# Patient Record
Sex: Female | Born: 1995
Health system: Southern US, Community
[De-identification: ages and names within clinical notes are randomized; demographics above are authoritative.]

## PROBLEM LIST (undated history)

## (undated) DIAGNOSIS — R079 Chest pain, unspecified: Secondary | ICD-10-CM

## (undated) DIAGNOSIS — I10 Essential (primary) hypertension: Secondary | ICD-10-CM

## (undated) DIAGNOSIS — N920 Excessive and frequent menstruation with regular cycle: Secondary | ICD-10-CM

## (undated) DIAGNOSIS — G459 Transient cerebral ischemic attack, unspecified: Secondary | ICD-10-CM

## (undated) DIAGNOSIS — N926 Irregular menstruation, unspecified: Secondary | ICD-10-CM

## (undated) DIAGNOSIS — E782 Mixed hyperlipidemia: Secondary | ICD-10-CM

## (undated) DIAGNOSIS — Z803 Family history of malignant neoplasm of breast: Secondary | ICD-10-CM

## (undated) DIAGNOSIS — E785 Hyperlipidemia, unspecified: Secondary | ICD-10-CM

## (undated) DIAGNOSIS — O210 Mild hyperemesis gravidarum: Secondary | ICD-10-CM

## (undated) DIAGNOSIS — F32A Depression, unspecified: Secondary | ICD-10-CM

## (undated) DIAGNOSIS — K6289 Other specified diseases of anus and rectum: Secondary | ICD-10-CM

## (undated) DIAGNOSIS — E559 Vitamin D deficiency, unspecified: Secondary | ICD-10-CM

## (undated) DIAGNOSIS — A749 Chlamydial infection, unspecified: Secondary | ICD-10-CM

## (undated) DIAGNOSIS — R002 Palpitations: Secondary | ICD-10-CM

## (undated) DIAGNOSIS — Z23 Encounter for immunization: Secondary | ICD-10-CM

## (undated) DIAGNOSIS — D689 Coagulation defect, unspecified: Secondary | ICD-10-CM

## (undated) DIAGNOSIS — O10919 Unspecified pre-existing hypertension complicating pregnancy, unspecified trimester: Secondary | ICD-10-CM

## (undated) DIAGNOSIS — K219 Gastro-esophageal reflux disease without esophagitis: Secondary | ICD-10-CM

## (undated) DIAGNOSIS — B2790 Infectious mononucleosis, unspecified without complication: Secondary | ICD-10-CM

## (undated) DIAGNOSIS — R0602 Shortness of breath: Secondary | ICD-10-CM

## (undated) DIAGNOSIS — R7303 Prediabetes: Secondary | ICD-10-CM

## (undated) DIAGNOSIS — F419 Anxiety disorder, unspecified: Secondary | ICD-10-CM

## (undated) HISTORY — DX: Vitamin D deficiency, unspecified: E55.9

## (undated) HISTORY — DX: Hyperlipidemia, unspecified: E78.5

## (undated) HISTORY — DX: Excessive and frequent menstruation with regular cycle: N92.0

## (undated) HISTORY — DX: Prediabetes: R73.03

## (undated) HISTORY — DX: Other specified diseases of anus and rectum: K62.89

## (undated) HISTORY — DX: Encounter for immunization: Z23

## (undated) HISTORY — DX: Chlamydial infection, unspecified: A74.9

## (undated) HISTORY — DX: Depression, unspecified: F32.A

## (undated) HISTORY — DX: Gastro-esophageal reflux disease without esophagitis: K21.9

## (undated) HISTORY — DX: Irregular menstruation, unspecified: N92.6

## (undated) HISTORY — DX: Family history of malignant neoplasm of breast: Z80.3

## (undated) HISTORY — DX: Infectious mononucleosis, unspecified without complication: B27.90

## (undated) HISTORY — PX: WISDOM TOOTH EXTRACTION: SHX21

---
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
## 2015-12-12 ENCOUNTER — Emergency Department
Admission: EM | Admit: 2015-12-12 | Discharge: 2015-12-12 | Disposition: A | Discharge status: Home / Self Care | Payer: BLUE CROSS/BLUE SHIELD | Attending: Emergency Medicine | Admitting: Emergency Medicine

## 2015-12-12 ENCOUNTER — Encounter: Payer: Self-pay | Admitting: Emergency Medicine

## 2015-12-12 DIAGNOSIS — R51 Headache: Secondary | ICD-10-CM | POA: Diagnosis not present

## 2015-12-12 DIAGNOSIS — M79602 Pain in left arm: Secondary | ICD-10-CM | POA: Diagnosis not present

## 2015-12-12 DIAGNOSIS — Z87891 Personal history of nicotine dependence: Secondary | ICD-10-CM | POA: Diagnosis not present

## 2015-12-12 HISTORY — DX: Coagulation defect, unspecified: D68.9

## 2015-12-12 HISTORY — DX: Transient cerebral ischemic attack, unspecified: G45.9

## 2015-12-12 LAB — BASIC METABOLIC PANEL
ANION GAP: 7 (ref 5–15)
BUN: 13 mg/dL (ref 6–20)
CO2: 26 mmol/L (ref 22–32)
Calcium: 9.6 mg/dL (ref 8.9–10.3)
Chloride: 105 mmol/L (ref 101–111)
Creatinine, Ser: 0.78 mg/dL (ref 0.44–1.00)
GFR calc Af Amer: >60 mL/min (ref >60)
GLUCOSE: 108 mg/dL — AB (ref 65–99)
POTASSIUM: 3.9 mmol/L (ref 3.5–5.1)
Sodium: 138 mmol/L (ref 135–145)

## 2015-12-12 LAB — CBC
HEMATOCRIT: 43.8 % (ref 35.0–47.0)
Hemoglobin: 15 g/dL (ref 12.0–16.0)
MCH: 27.4 pg (ref 26.0–34.0)
MCHC: 34.2 g/dL (ref 32.0–36.0)
MCV: 80.2 fL (ref 80.0–100.0)
PLATELETS: 335 10*3/uL (ref 150–440)
RBC: 5.46 MIL/uL — AB (ref 3.80–5.20)
RDW: 13.2 % (ref 11.5–14.5)
WBC: 6 10*3/uL (ref 3.6–11.0)

## 2015-12-12 MED ORDER — KETOROLAC TROMETHAMINE 30 MG/ML IJ SOLN
30.0000 mg | Freq: Once | INTRAMUSCULAR | Status: AC
  Administered 2015-12-12: 30 mg via INTRAVENOUS

## 2015-12-12 MED ORDER — PROCHLORPERAZINE EDISYLATE 5 MG/ML IJ SOLN
10.0000 mg | Freq: Once | INTRAMUSCULAR | Status: AC
  Administered 2015-12-12: 10 mg via INTRAVENOUS

## 2015-12-12 NOTE — ED Notes (Signed)
Pt reports pain in left arm began after she looked down and had pain first in left side of neck that traveled throughout left side from head to hip per pts report.

## 2015-12-12 NOTE — ED Notes (Signed)
Pt requested some crackers and drink, this nurse checked with EDP who said she could. Crackers, peanut butter and ginger ale given to pt.

## 2015-12-12 NOTE — ED Notes (Signed)
PT ambulatory at time of discharge. No acute distress noted at this time. Pt verbalized understanding of d/c instructions.

## 2015-12-12 NOTE — ED Provider Notes (Signed)
[facility] Emergency Department Provider Note    ____________________________________________   I have reviewed the triage vital signs and the nursing notes.   HISTORY  Chief Complaint Headache and Arm Pain   History limited by: Not Limited   HPI [name] is a 20 y.o. female who presents to the emergency department today because of concern for left sided pain. The pain is primarily located in the left arm. It starts in her shoulder and goes down to her fingers. She does feel some numbness in this area. It has improved since it started earlier today. The patient also complains of left sided migraine pain. Her headache was worse with bright lights. She has a history of migraine, back when she was in school. She denies being on any estrogen medication. No fevers.   Past Medical History:  Diagnosis Date  . Clotting disorder (HCC)   . Esophageal reflux   . Proctalgia    Intermittently  . TIA (transient ischemic attack) 2013    Patient Active Problem List   Diagnosis Date Noted  . Esophageal reflux   . Proctalgia     History reviewed. No pertinent surgical history.  Prior to Admission medications   Medication Sig Start Date End Date Taking? Authorizing Provider  pantoprazole (PROTONIX) 40 MG tablet Take 40 mg by mouth daily.   05/31/10   Historical Provider, MD  polyethylene glycol powder (GLYCOLAX/MIRALAX) powder Take 17 g by mouth daily.   05/31/10   Historical Provider, MD    Allergies Estrogens  History reviewed. No pertinent family history.  Social History Social History  Substance Use Topics  . Smoking status: Former Smoker    Types: Cigarettes    Quit date: 09/2015  . Smokeless tobacco: Never Used  . Alcohol use Yes     Comment: occasionally     Review of Systems  Constitutional: Negative for fever. Cardiovascular: Negative for chest pain. Respiratory: Negative for shortness of breath. Gastrointestinal: Negative for abdominal  pain, vomiting and diarrhea. Genitourinary: Negative for dysuria. Musculoskeletal: Negative for back pain. Skin: Negative for rash. Neurological: Positive for headache. Positive for left arm numbness.   10-point ROS otherwise negative.  ____________________________________________   PHYSICAL EXAM:  VITAL SIGNS: ED Triage Vitals  Enc Vitals Group     BP 12/12/15 1251 (!) 146/99     Pulse Rate 12/12/15 1251 90     Resp 12/12/15 1251 18     Temp 12/12/15 1251 98.2 F (36.8 C)     Temp Source 12/12/15 1251 Oral     SpO2 12/12/15 1251 94 %     Weight 12/12/15 1251 260 lb (117.9 kg)     Height 12/12/15 1251 5\' 8"  (1.727 m)     Head Circumference --      Peak Flow --      Pain Score 12/12/15 1255 5   Constitutional: Alert and oriented. Well appearing and in no distress. Eyes: Conjunctivae are normal. Normal extraocular movements. ENT   Head: Normocephalic and atraumatic.   Nose: No congestion/rhinnorhea.   Mouth/Throat: Mucous membranes are moist.   Neck: No stridor. Hematological/Lymphatic/Immunilogical: No cervical lymphadenopathy. Cardiovascular: Normal rate, regular rhythm.  No murmurs, rubs, or gallops.  Respiratory: Normal respiratory effort without tachypnea nor retractions. Breath sounds are clear and equal bilaterally. No wheezes/rales/rhonchi. Gastrointestinal: Soft and nontender. No distention.  Genitourinary: Deferred Musculoskeletal: Normal range of motion in all extremities. Left shoulder however tender to elevation and palpation. No lower extremity edema. Neurologic:  Normal speech  and language. No gross focal neurologic deficits are appreciated.  Skin:  Skin is warm, dry and intact. No rash noted. Psychiatric: Mood and affect are normal. Speech and behavior are normal. Patient exhibits appropriate insight and judgment.  ____________________________________________    LABS (pertinent positives/negatives)   Labs Reviewed  CBC - Abnormal; Notable  for the following:       Result Value   RBC 5.46 (*)    All other components within normal limits  BASIC METABOLIC PANEL - Abnormal; Notable for the following:    Glucose, Bld 108 (*)    All other components within normal limits    ____________________________________________   EKG  None  ____________________________________________    RADIOLOGY  None  ____________________________________________   PROCEDURES  Procedures  ____________________________________________   INITIAL IMPRESSION / ASSESSMENT AND PLAN / ED COURSE  Pertinent labs & imaging results that were available during my care of the patient were reviewed by me and considered in my medical decision making (see chart for details).  Patient with primary concern for left arm pain and migraine. On exam there is tenderness to manipulation of the left shoulder. Additionally it is tender to palpation. I wonder if radiculopathy is cause of pain and numbness. Will give patient toradol and compazine for headache  Clinical Course    Patient presented to the nursing station requesting discharge. She stated that she did feel better. Will discharge to follow up with PCP. ____________________________________________   FINAL CLINICAL IMPRESSION(S) / ED DIAGNOSES  Final diagnoses:  Left arm pain     Note: This dictation was prepared with Dragon dictation. Any transcriptional errors that result from this process are unintentional    Sierra SemenGraydon Leesha Veno, MD 12/12/15 1610

## 2015-12-12 NOTE — Discharge Instructions (Signed)
Please seek medical attention for any high fevers, chest pain, shortness of breath, change in behavior, persistent vomiting, bloody stool or any other new or concerning symptoms.  

## 2015-12-12 NOTE — ED Triage Notes (Signed)
Pt states she woke up this morning with pain and stiffness to entire L side. C/o headache 4/10. Currently only complaint is achy pain to LUE from shoulder to hand. No c/o numbness at this time. Hx TIA at 20yo. No focal weakness, no facial droop, no slurred speech noted.

## 2016-01-02 DIAGNOSIS — J029 Acute pharyngitis, unspecified: Secondary | ICD-10-CM | POA: Diagnosis not present

## 2016-03-28 ENCOUNTER — Ambulatory Visit: Payer: Self-pay | Admitting: Obstetrics and Gynecology

## 2016-05-04 ENCOUNTER — Ambulatory Visit (INDEPENDENT_AMBULATORY_CARE_PROVIDER_SITE_OTHER): Payer: BLUE CROSS/BLUE SHIELD | Admitting: Obstetrics and Gynecology

## 2016-05-04 ENCOUNTER — Encounter: Payer: Self-pay | Admitting: Obstetrics and Gynecology

## 2016-05-04 VITALS — BP 140/90 | HR 78 | Ht 69.0 in | Wt 260.0 lb

## 2016-05-04 DIAGNOSIS — F418 Other specified anxiety disorders: Secondary | ICD-10-CM

## 2016-05-04 DIAGNOSIS — Z803 Family history of malignant neoplasm of breast: Secondary | ICD-10-CM | POA: Diagnosis not present

## 2016-05-04 DIAGNOSIS — D6852 Prothrombin gene mutation: Secondary | ICD-10-CM

## 2016-05-04 DIAGNOSIS — F419 Anxiety disorder, unspecified: Secondary | ICD-10-CM

## 2016-05-04 DIAGNOSIS — Z113 Encounter for screening for infections with a predominantly sexual mode of transmission: Secondary | ICD-10-CM | POA: Diagnosis not present

## 2016-05-04 DIAGNOSIS — Z01419 Encounter for gynecological examination (general) (routine) without abnormal findings: Secondary | ICD-10-CM

## 2016-05-04 DIAGNOSIS — Z124 Encounter for screening for malignant neoplasm of cervix: Secondary | ICD-10-CM

## 2016-05-04 DIAGNOSIS — F329 Major depressive disorder, single episode, unspecified: Secondary | ICD-10-CM

## 2016-05-04 MED ORDER — SERTRALINE HCL 50 MG PO TABS: 50.0000 mg | ORAL_TABLET | Freq: Every day | ORAL | 1 refills | Status: DC

## 2016-05-04 NOTE — Progress Notes (Signed)
Chief Complaint  Patient presents with  . Gynecologic Exam    periods and paxil     HPI:      [name] is a 21 y.o. G0P0000 who LMP was Patient's last menstrual period was 04/19/2016., presents today for her annual examination.  Her menses are Q1-3 months, lasting 5 days.  Dysmenorrhea mild, occurring first 1-2 days of flow. She does not have intermenstrual bleeding. She had 2 periods in March but this is unusual for pt. She has been under increased stress recently.  Sex activity: not sexually active.   Hx of STDs: chlamydia  There is a FH of breast cancer in her pat aunt and questionable PGM, genetic testing hasn't been done. There is no FH of ovarian cancer. The patient does not do self-breast exams.  Tobacco use: The patient denies current or previous tobacco use. Alcohol use: none Exercise: moderately active  She does not get adequate calcium and Vitamin D in her diet.  She has a hx of anxiety and depression since adolescence. She tried paxil last yr for 2 months but felt it made her more angry and unable to concentrate, so she stopped it. She also would rather not be on any meds but feels her anxiety is constant and she doesn't like that. She is very busy with school/work and feels the stress exacerbates sx. She is sleeping well and has just started diet/exercise program. She feels extra emotional. She doesn't have time to see therapist right now, but has seen one in the past. She is interested in meds again. She notes feeling down, tired with poor apptetite, and trouble concentrating. She has had SI. She also feels nervous, with excessive worry, trouble relaxing, and being irritable. Some of her sx are situational and come and go.   Past Medical History:  Diagnosis Date  . Chlamydia 6/15, 11/15, 2/16  . Clotting disorder (HCC) 2012   PROTHROMBIN GENE MUTATION AND fACTOR viii - NO ESTROGEN PRODUCTS  . Esophageal reflux   . Family history of breast cancer 05/08/2013   WILL DISCUSS GENETIC TESTING WHEN PT TURNS 21  . Immunization, viral disease    GARDASIL SERIES COMPLETED  . Irregular menses 03/19/2012  . Menorrhagia 2012   OFF BC  . Mononucleosis 01/08/2014   [facility]  . Pre-diabetes 2014   [facility] IN THE PAST  . Proctalgia    Intermittently  . TIA (transient ischemic attack) 2013    Past Surgical History:  Procedure Laterality Date  . WISDOM TOOTH EXTRACTION      Family History  Problem Relation Age of Onset  . Clotting disorder Mother   . Breast cancer Paternal Aunt   . Hypertension Maternal Grandmother   . Heart disease Maternal Grandfather   . Heart attack Maternal Grandfather     Social History   Social History  . Marital status: Single    Spouse name: N/A  . Number of children: N/A  . Years of education: N/A   Occupational History  . Not on file.   Social History Main Topics  . Smoking status: Former Smoker    Types: Cigarettes    Quit date: 09/2015  . Smokeless tobacco: Never Used  . Alcohol use Yes     Comment: occasionally   . Drug use: No  . Sexual activity: Yes    Birth control/ protection: Condom   Other Topics Concern  . Not on file   Social History Narrative  . No narrative on file  Review of Systems  Constitutional: Negative for fever, malaise/fatigue and weight loss.  HENT: Negative for congestion, ear pain and sinus pain.   Respiratory: Negative for cough, shortness of breath and wheezing.   Cardiovascular: Negative for chest pain, orthopnea and leg swelling.  Gastrointestinal: Negative for constipation, diarrhea, nausea and vomiting.  Genitourinary: Negative for dysuria, frequency, hematuria and urgency.       Breast ROS: negative   Musculoskeletal: Negative for back pain, joint pain and myalgias.  Skin: Negative for itching and rash.  Neurological: Positive for headaches. Negative for dizziness, tingling and focal weakness.  Endo/Heme/Allergies: Negative for environmental allergies.  Does not bruise/bleed easily.  Psychiatric/Behavioral: Positive for depression. Negative for suicidal ideas. The patient is nervous/anxious. The patient does not have insomnia.     Objective: BP 140/90 (Patient Position: Sitting)   Pulse 78   Ht  (1.753 m)   Wt 260 lb (117.9 kg)   LMP 04/19/2016   BMI 38.40 kg/m    Physical Exam  Constitutional: She is oriented to person, place, and time. She appears well-developed and well-nourished.  Genitourinary: Vagina normal and uterus normal. No erythema or tenderness in the vagina. No vaginal discharge found. Right adnexum does not display mass and does not display tenderness. Left adnexum does not display mass and does not display tenderness. Cervix does not exhibit motion tenderness or polyp. Uterus is not enlarged or tender.  Neck: Normal range of motion. No thyromegaly present.  Cardiovascular: Normal rate, regular rhythm and normal heart sounds.   No murmur heard. Pulmonary/Chest: Effort normal and breath sounds normal. Right breast exhibits no mass, no nipple discharge, no skin change and no tenderness. Left breast exhibits no mass, no nipple discharge, no skin change and no tenderness.  Abdominal: Soft. There is no tenderness. There is no guarding.  Musculoskeletal: Normal range of motion.  Neurological: She is alert and oriented to person, place, and time. No cranial nerve deficit.  Psychiatric: She has a normal mood and affect. Her behavior is normal.  Vitals reviewed.   Results: GAD-7=20 PHQ-9=22  Assessment/Plan: Encounter for annual routine gynecological examination  Cervical cancer screening - Plan: IGP,CtNgTv,rfx Aptima HPV ASCU  Screening for STD (sexually transmitted disease) - Plan: IGP,CtNgTv,rfx Aptima HPV ASCU  Prothrombin gene mutation (HCC)  Family history of breast cancer - My Risk testing discussed. Will wait till pt is a little older. F/u prn.  Anxiety and depression - Will try zoloft. Pt didn't do well  with paxil. RTO in 7 wks for f/u/sooner prn. - Plan: sertraline (ZOLOFT) 50 MG tablet             GYN counsel adequate intake of calcium and vitamin D, diet and exercise     F/U  Return in about 7 weeks (around 06/22/2016) for anxiety f/u.  Ameya Vowell B. Audra Kagel, PA-C 05/04/2016 5:33 PM

## 2016-05-09 LAB — IGP,CTNGTV,RFX APTIMA HPV ASCU
CHLAMYDIA, NUC. ACID AMP: NEGATIVE
GONOCOCCUS, NUC. ACID AMP: NEGATIVE
PAP SMEAR COMMENT: 0
TRICH VAG BY NAA: NEGATIVE

## 2016-05-20 DIAGNOSIS — J0120 Acute ethmoidal sinusitis, unspecified: Secondary | ICD-10-CM | POA: Diagnosis not present

## 2016-06-16 ENCOUNTER — Encounter: Payer: Self-pay | Admitting: Obstetrics and Gynecology

## 2016-06-16 ENCOUNTER — Ambulatory Visit (INDEPENDENT_AMBULATORY_CARE_PROVIDER_SITE_OTHER): Payer: BLUE CROSS/BLUE SHIELD | Admitting: Obstetrics and Gynecology

## 2016-06-16 VITALS — BP 130/90 | HR 94 | Ht 69.0 in | Wt 256.0 lb

## 2016-06-16 DIAGNOSIS — R040 Epistaxis: Secondary | ICD-10-CM

## 2016-06-16 DIAGNOSIS — F419 Anxiety disorder, unspecified: Secondary | ICD-10-CM

## 2016-06-16 DIAGNOSIS — F329 Major depressive disorder, single episode, unspecified: Secondary | ICD-10-CM | POA: Insufficient documentation

## 2016-06-16 MED ORDER — SERTRALINE HCL 100 MG PO TABS: 100.0000 mg | ORAL_TABLET | Freq: Every day | ORAL | 0 refills | Status: DC

## 2016-06-16 NOTE — Progress Notes (Signed)
Chief Complaint  Patient presents with  . Follow-up    HPI:      [name] is a 21 y.o. G0P0000 who LMP was Patient's last menstrual period was 06/11/2016., presents today for anxiety/depression f/u from 05/04/16. She was started on zoloft 50 mg daily. She states she has noticed some improvement of anxiety/depression sx but still has worry, sleeps a lot, has little energy/motivation, difficulty concentrating. She feels less angry. She still has SI, but they are less frequent and less "heavy", and she wouldn't actually go through with it. She denies any side effects from zoloft.   05/04/16 She has a hx of anxiety and depression since adolescence. She tried paxil last yr for 2 months but felt it made her more angry and unable to concentrate, so she stopped it. She also would rather not be on any meds but feels her anxiety is constant and she doesn't like that. She is very busy with school/work and feels the stress exacerbates sx. She is sleeping well and has just started diet/exercise program. She feels extra emotional. She doesn't have time to see therapist right now, but has seen one in the past. She is interested in meds again. She notes feeling down, tired with poor apptetite, and trouble concentrating. She has had SI. She also feels nervous, with excessive worry, trouble relaxing, and being irritable. Some of her sx are situational and come and go.  Pt also complains of frequent nose bleeds, bilat. Sx started 12/17 and occur 1-3 times weekly, even with just minimally touching her nose. Sx are random. She had 2 nose piercings and removed them, but sx persist. She sometimes needs 1 tissue or a bunch of paper towels. She denies any trauma to nose, nasal tissue doesn't feel dry.   Patient Active Problem List   Diagnosis Date Noted  . Anxiety and depression 06/16/2016  . Hypertension 02/01/2012  . Insulin resistance 02/01/2012  . Obesity 02/01/2012  . Pancreatitis, acute 02/01/2012  .  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 Esophageal reflux   . Proctalgia     Family History  Problem Relation Age of Onset  . Clotting disorder Mother   . Breast cancer Paternal Aunt   . Hypertension Maternal Grandmother   . Heart disease Maternal Grandfather   . Heart attack Maternal Grandfather     Social History   Social History  . Marital status: Single    Spouse name: N/A  . Number of children: N/A  . Years of education: N/A   Occupational History  . Not on file.   Social History Main Topics  . Smoking status: Former Smoker    Types: Cigarettes    Quit date: 09/2015  . Smokeless tobacco: Never Used  . Alcohol use Yes     Comment: occasionally   . Drug use: No  . Sexual activity: Yes    Birth control/ protection: Condom   Other Topics Concern  . Not on file   Social History Narrative  . No narrative on file     Current Outpatient Prescriptions:  .  sertraline (ZOLOFT) 100 MG tablet, Take 1 tablet (100 mg total) by mouth daily., Disp: 30 tablet, Rfl: 0  Review of Systems  Constitutional: Positive for appetite change.  Neurological: Positive for headaches. Negative for dizziness, light-headedness and numbness.  Psychiatric/Behavioral: Positive for agitation, dysphoric mood, sleep disturbance and suicidal ideas. Negative for hallucinations and self-injury. The patient is nervous/anxious.      OBJECTIVE:  Vitals:  BP 130/90   Pulse 94   Ht 5\' 9"  (1.753 m)   Wt 256 lb (116.1 kg)   LMP 06/11/2016   BMI 37.80 kg/m   Physical Exam  DEFERRED  Results: GAD-7=14 (WAS 20) PHG-9=17 (WAS 22)  Assessment/Plan: Anxiety and depression - Increase zoloft to 100 mg dose. Rx eRxd. F/u in 4 wks for sx/sooner prn. - Plan: sertraline (ZOLOFT) 100 MG tablet  Epistaxis, recurrent - Refer to ENT.  - Plan: Ambulatory referral to ENT     Meds ordered this encounter  Medications  . sertraline (ZOLOFT) 100 MG tablet    Sig: Take 1 tablet (100 mg total)  by mouth daily.    Dispense:  30 tablet    Refill:  0      Return in about 4 weeks (around 07/14/2016) for depression f/u.  Alicia B. Copland, PA-C 06/16/2016 10:23 AM

## 2016-07-05 ENCOUNTER — Other Ambulatory Visit: Payer: Self-pay | Admitting: Obstetrics and Gynecology

## 2016-07-13 ENCOUNTER — Other Ambulatory Visit: Payer: Self-pay | Admitting: Obstetrics and Gynecology

## 2016-07-13 ENCOUNTER — Ambulatory Visit: Payer: BLUE CROSS/BLUE SHIELD | Admitting: Obstetrics and Gynecology

## 2016-07-13 DIAGNOSIS — F329 Major depressive disorder, single episode, unspecified: Secondary | ICD-10-CM

## 2016-07-13 DIAGNOSIS — F419 Anxiety disorder, unspecified: Principal | ICD-10-CM

## 2016-07-18 ENCOUNTER — Ambulatory Visit: Payer: BLUE CROSS/BLUE SHIELD | Admitting: Obstetrics and Gynecology

## 2017-01-30 ENCOUNTER — Ambulatory Visit (INDEPENDENT_AMBULATORY_CARE_PROVIDER_SITE_OTHER): Payer: 59 | Admitting: Obstetrics and Gynecology

## 2017-01-30 ENCOUNTER — Encounter: Payer: Self-pay | Admitting: Obstetrics and Gynecology

## 2017-01-30 VITALS — BP 138/98 | HR 92 | Ht 69.0 in | Wt 254.0 lb

## 2017-01-30 DIAGNOSIS — N926 Irregular menstruation, unspecified: Secondary | ICD-10-CM

## 2017-01-30 DIAGNOSIS — Z309 Encounter for contraceptive management, unspecified: Secondary | ICD-10-CM

## 2017-01-30 LAB — POCT URINE PREGNANCY: Preg Test, Ur: NEGATIVE

## 2017-01-30 NOTE — Progress Notes (Signed)
Chief Complaint  Patient presents with  . Late Period    HPI:      [name] is a 22 y.o. G0P0000 who LMP was Patient's last menstrual period was 12/26/2016., presents today for late menses, as well as urinary frequency, fatigue, and breast tenderness. Menses are usually monthly, lasting 5-6 days, no BTB, occas dysmen. She is sex active, using pull out method/natural family planning. She had 3 mildly faint pos UPTs 2 days ago, 2 neg UPTs yesterday, and neg UPT today in office, but not first morning specimen. She also has a hx of menses Q1-3 months in the past. No recent sickness/med change/wt change/increased stress. She has a clotting disorder and is high risk if gets pregnant.  She stopped her zoloft a few months back for anxiety/depression. Sx are manageable.    Past Medical History:  Diagnosis Date  . Chlamydia 6/15, 11/15, 2/16  . Clotting disorder (HCC) 2012   PROTHROMBIN GENE MUTATION AND fACTOR viii - NO ESTROGEN PRODUCTS  . Esophageal reflux   . Family history of breast cancer 05/08/2013   WILL DISCUSS GENETIC TESTING WHEN PT TURNS 21  . Immunization, viral disease    GARDASIL SERIES COMPLETED  . Irregular menses 03/19/2012  . Menorrhagia 2012   OFF BC  . Mononucleosis 01/08/2014   [facility]  . Pre-diabetes 2014   [facility] IN THE PAST  . Proctalgia    Intermittently  . TIA (transient ischemic attack) 2013    Past Surgical History:  Procedure Laterality Date  . WISDOM TOOTH EXTRACTION      Family History  Problem Relation Age of Onset  . Clotting disorder Mother   . Breast cancer Paternal Aunt   . Hypertension Maternal Grandmother   . Heart disease Maternal Grandfather   . Heart attack Maternal Grandfather     Social History   Socioeconomic History  . Marital status: Single    Spouse name: Not on file  . Number of children: Not on file  . Years of education: Not on file  . Highest education level: Not on file  Social Needs  . Financial  resource strain: Not on file  . Food insecurity - worry: Not on file  . Food insecurity - inability: Not on file  . Transportation needs - medical: Not on file  . Transportation needs - non-medical: Not on file  Occupational History  . Not on file  Tobacco Use  . Smoking status: Former Smoker    Types: Cigarettes    Last attempt to quit: 09/2015    Years since quitting: 1.3  . Smokeless tobacco: Never Used  Substance and Sexual Activity  . Alcohol use: Yes    Comment: occasionally   . Drug use: No  . Sexual activity: Yes    Birth control/protection: Condom, None  Other Topics Concern  . Not on file  Social History Narrative  . Not on file     Current Outpatient Medications:  .  sertraline (ZOLOFT) 100 MG tablet, TAKE 1 TABLET BY MOUTH EVERY DAY, Disp: 30 tablet, Rfl: 0   ROS:  Review of Systems  Constitutional: Positive for fatigue. Negative for fever.  Gastrointestinal: Negative for blood in stool, constipation, diarrhea, nausea and vomiting.  Genitourinary: Positive for frequency. Negative for dyspareunia, dysuria, flank pain, hematuria, urgency, vaginal bleeding, vaginal discharge and vaginal pain.  Musculoskeletal: Negative for back pain.  Skin: Negative for rash.     OBJECTIVE:   Vitals:  BP (!) 138/98  Pulse 92   Ht 5\' 9"  (1.753 m)   Wt 254 lb (115.2 kg)   LMP 12/26/2016   BMI 37.51 kg/m   Physical Exam  Constitutional: She is oriented to person, place, and time and well-developed, well-nourished, and in no distress.  Neurological: She is alert and oriented to person, place, and time.  Psychiatric: Memory, affect and judgment normal.  Vitals reviewed.   Results: Results for orders placed or performed in visit on 01/30/17 (from the past 24 hour(s))  POCT urine pregnancy     Status: Normal   Collection Time: 01/30/17  3:59 PM  Result Value Ref Range   Preg Test, Ur Negative Negative     Assessment/Plan: Late period - Neg UPT today. Slightly  pos UPTs at home 2 days ago. Check serum beta. Will f/u with results. If neg, reassurance. F/u if no menses in 3 months.  - Plan: POCT urine pregnancy, hCG, serum, qualitative  Encounter for contraceptive management, unspecified type - Pt not interested in BC/condoms    Return if symptoms worsen or fail to improve.  Wynton Hufstetler B. Levana Minetti, PA-C 01/30/2017 4:20 PM

## 2017-01-30 NOTE — Patient Instructions (Signed)
I value your feedback and entrusting us with your care. If you get a [facility] patient survey, I would appreciate you taking the time to let us know about your experience today. Thank you! 

## 2017-01-31 LAB — HCG, SERUM, QUALITATIVE: HCG, BETA SUBUNIT, QUAL, SERUM: NEGATIVE m[IU]/mL (ref <6)

## 2017-03-20 ENCOUNTER — Other Ambulatory Visit: Payer: Self-pay

## 2017-03-20 ENCOUNTER — Other Ambulatory Visit: Payer: Self-pay | Admitting: Obstetrics and Gynecology

## 2017-03-20 ENCOUNTER — Telehealth: Payer: Self-pay

## 2017-03-20 ENCOUNTER — Ambulatory Visit (INDEPENDENT_AMBULATORY_CARE_PROVIDER_SITE_OTHER): Payer: BLUE CROSS/BLUE SHIELD

## 2017-03-20 DIAGNOSIS — N3001 Acute cystitis with hematuria: Secondary | ICD-10-CM

## 2017-03-20 DIAGNOSIS — N390 Urinary tract infection, site not specified: Secondary | ICD-10-CM | POA: Insufficient documentation

## 2017-03-20 DIAGNOSIS — R300 Dysuria: Secondary | ICD-10-CM | POA: Diagnosis not present

## 2017-03-20 LAB — POCT URINALYSIS DIPSTICK
BILIRUBIN UA: NEGATIVE
Glucose, UA: NEGATIVE
Ketones, UA: NEGATIVE
NITRITE UA: POSITIVE
PH UA: 7 (ref 5.0–8.0)
PROTEIN UA: NEGATIVE
RBC UA: POSITIVE
SPEC GRAV UA: 1.02 (ref 1.010–1.025)
UROBILINOGEN UA: NEGATIVE U/dL — AB

## 2017-03-20 MED ORDER — NITROFURANTOIN MONOHYD MACRO 100 MG PO CAPS: 100.0000 mg | ORAL_CAPSULE | Freq: Two times a day (BID) | ORAL | 0 refills | Status: AC

## 2017-03-20 NOTE — Telephone Encounter (Signed)
Pt states she has another UTI, has tried AZO, now sxs are 10times worse - pain c urination, frequency.  Can antibx be called in?  [id number].

## 2017-03-20 NOTE — Telephone Encounter (Signed)
Pt scheduled for 1:30 today

## 2017-03-20 NOTE — Progress Notes (Signed)
UTI sx for a couple days. Pos dipstick. Check C&S. Rx macrobid.   Results for orders placed or performed in visit on 03/20/17 (from the past 24 hour(s))  POCT Urinalysis Dipstick     Status: Abnormal   Collection Time: 03/20/17  1:47 PM  Result Value Ref Range   Color, UA dark yellow    Clarity, UA cloudy    Glucose, UA neg    Bilirubin, UA neg    Ketones, UA neg    Spec Grav, UA 1.020 1.010 - 1.025   Blood, UA pos    pH, UA 7.0 5.0 - 8.0   Protein, UA neg    Urobilinogen, UA negative (A) 0.2 or 1.0 E.U./dL   Nitrite, UA pos    Leukocytes, UA Large (3+) (A) Negative   Appearance dark yellow    Odor strong

## 2017-03-20 NOTE — Telephone Encounter (Signed)
No C&S since 2016, have pt come by with nurse for urine dipstick today. I can Rx abx if pos. Thx.

## 2017-03-24 LAB — URINE CULTURE

## 2017-05-02 DIAGNOSIS — Z01411 Encounter for gynecological examination (general) (routine) with abnormal findings: Secondary | ICD-10-CM | POA: Diagnosis not present

## 2017-05-02 DIAGNOSIS — Z113 Encounter for screening for infections with a predominantly sexual mode of transmission: Secondary | ICD-10-CM | POA: Diagnosis not present

## 2017-05-02 DIAGNOSIS — Z3169 Encounter for other general counseling and advice on procreation: Secondary | ICD-10-CM | POA: Diagnosis not present

## 2017-05-02 DIAGNOSIS — Z7689 Persons encountering health services in other specified circumstances: Secondary | ICD-10-CM | POA: Diagnosis not present

## 2017-05-11 ENCOUNTER — Ambulatory Visit: Payer: BLUE CROSS/BLUE SHIELD

## 2017-08-23 DIAGNOSIS — I150 Renovascular hypertension: Secondary | ICD-10-CM | POA: Diagnosis not present

## 2017-08-23 DIAGNOSIS — F413 Other mixed anxiety disorders: Secondary | ICD-10-CM | POA: Diagnosis not present

## 2017-10-18 DIAGNOSIS — F413 Other mixed anxiety disorders: Secondary | ICD-10-CM | POA: Diagnosis not present

## 2017-10-18 DIAGNOSIS — Z79899 Other long term (current) drug therapy: Secondary | ICD-10-CM | POA: Diagnosis not present

## 2017-11-24 DIAGNOSIS — E559 Vitamin D deficiency, unspecified: Secondary | ICD-10-CM | POA: Insufficient documentation

## 2017-11-29 DIAGNOSIS — Z7689 Persons encountering health services in other specified circumstances: Secondary | ICD-10-CM | POA: Diagnosis not present

## 2017-11-29 DIAGNOSIS — F419 Anxiety disorder, unspecified: Secondary | ICD-10-CM | POA: Diagnosis not present

## 2017-11-29 DIAGNOSIS — Z23 Encounter for immunization: Secondary | ICD-10-CM | POA: Diagnosis not present

## 2017-11-29 DIAGNOSIS — Z13220 Encounter for screening for lipoid disorders: Secondary | ICD-10-CM | POA: Diagnosis not present

## 2017-11-29 DIAGNOSIS — E669 Obesity, unspecified: Secondary | ICD-10-CM | POA: Diagnosis not present

## 2017-11-29 DIAGNOSIS — D6852 Prothrombin gene mutation: Secondary | ICD-10-CM | POA: Diagnosis not present

## 2017-11-29 DIAGNOSIS — D66 Hereditary factor VIII deficiency: Secondary | ICD-10-CM | POA: Diagnosis not present

## 2017-11-29 DIAGNOSIS — F329 Major depressive disorder, single episode, unspecified: Secondary | ICD-10-CM | POA: Diagnosis not present

## 2017-11-29 DIAGNOSIS — E8881 Metabolic syndrome: Secondary | ICD-10-CM | POA: Diagnosis not present

## 2017-11-29 DIAGNOSIS — I10 Essential (primary) hypertension: Secondary | ICD-10-CM | POA: Diagnosis not present

## 2017-11-29 DIAGNOSIS — Z111 Encounter for screening for respiratory tuberculosis: Secondary | ICD-10-CM | POA: Diagnosis not present

## 2017-12-28 DIAGNOSIS — F419 Anxiety disorder, unspecified: Secondary | ICD-10-CM | POA: Diagnosis not present

## 2017-12-28 DIAGNOSIS — E559 Vitamin D deficiency, unspecified: Secondary | ICD-10-CM | POA: Diagnosis not present

## 2017-12-28 DIAGNOSIS — I10 Essential (primary) hypertension: Secondary | ICD-10-CM | POA: Diagnosis not present

## 2017-12-28 DIAGNOSIS — E669 Obesity, unspecified: Secondary | ICD-10-CM | POA: Diagnosis not present

## 2018-01-19 DIAGNOSIS — N390 Urinary tract infection, site not specified: Secondary | ICD-10-CM | POA: Diagnosis not present

## 2018-01-19 DIAGNOSIS — R300 Dysuria: Secondary | ICD-10-CM | POA: Diagnosis not present

## 2018-01-19 DIAGNOSIS — A499 Bacterial infection, unspecified: Secondary | ICD-10-CM | POA: Diagnosis not present

## 2018-04-17 DIAGNOSIS — F329 Major depressive disorder, single episode, unspecified: Secondary | ICD-10-CM | POA: Diagnosis not present

## 2018-04-17 DIAGNOSIS — F419 Anxiety disorder, unspecified: Secondary | ICD-10-CM | POA: Diagnosis not present

## 2018-04-17 DIAGNOSIS — Z113 Encounter for screening for infections with a predominantly sexual mode of transmission: Secondary | ICD-10-CM | POA: Diagnosis not present

## 2018-05-17 DIAGNOSIS — R300 Dysuria: Secondary | ICD-10-CM | POA: Diagnosis not present

## 2018-05-17 DIAGNOSIS — R350 Frequency of micturition: Secondary | ICD-10-CM | POA: Diagnosis not present

## 2018-05-17 DIAGNOSIS — N941 Unspecified dyspareunia: Secondary | ICD-10-CM | POA: Diagnosis not present

## 2018-05-17 DIAGNOSIS — N898 Other specified noninflammatory disorders of vagina: Secondary | ICD-10-CM | POA: Diagnosis not present

## 2018-06-04 DIAGNOSIS — R300 Dysuria: Secondary | ICD-10-CM | POA: Diagnosis not present

## 2018-06-07 DIAGNOSIS — N898 Other specified noninflammatory disorders of vagina: Secondary | ICD-10-CM | POA: Diagnosis not present

## 2018-08-06 DIAGNOSIS — O26891 Other specified pregnancy related conditions, first trimester: Secondary | ICD-10-CM | POA: Diagnosis not present

## 2018-08-06 DIAGNOSIS — L292 Pruritus vulvae: Secondary | ICD-10-CM | POA: Diagnosis not present

## 2018-08-06 DIAGNOSIS — N898 Other specified noninflammatory disorders of vagina: Secondary | ICD-10-CM | POA: Diagnosis not present

## 2018-08-06 DIAGNOSIS — N912 Amenorrhea, unspecified: Secondary | ICD-10-CM | POA: Diagnosis not present

## 2018-08-18 DIAGNOSIS — H938X3 Other specified disorders of ear, bilateral: Secondary | ICD-10-CM | POA: Diagnosis not present

## 2018-08-18 DIAGNOSIS — H6123 Impacted cerumen, bilateral: Secondary | ICD-10-CM | POA: Diagnosis not present

## 2018-08-27 DIAGNOSIS — D6852 Prothrombin gene mutation: Secondary | ICD-10-CM | POA: Diagnosis not present

## 2018-08-27 DIAGNOSIS — N898 Other specified noninflammatory disorders of vagina: Secondary | ICD-10-CM | POA: Diagnosis not present

## 2018-08-27 DIAGNOSIS — I10 Essential (primary) hypertension: Secondary | ICD-10-CM | POA: Diagnosis not present

## 2018-08-27 DIAGNOSIS — O0991 Supervision of high risk pregnancy, unspecified, first trimester: Secondary | ICD-10-CM | POA: Diagnosis not present

## 2018-08-28 ENCOUNTER — Encounter: Payer: Self-pay | Admitting: Medical Oncology

## 2018-08-28 ENCOUNTER — Other Ambulatory Visit: Payer: Self-pay

## 2018-08-28 ENCOUNTER — Emergency Department
Admission: EM | Admit: 2018-08-28 | Discharge: 2018-08-28 | Disposition: A | Discharge status: Home / Self Care | Payer: BC Managed Care – PPO | Attending: Student | Admitting: Student

## 2018-08-28 DIAGNOSIS — Z79899 Other long term (current) drug therapy: Secondary | ICD-10-CM | POA: Insufficient documentation

## 2018-08-28 DIAGNOSIS — Z87891 Personal history of nicotine dependence: Secondary | ICD-10-CM | POA: Diagnosis not present

## 2018-08-28 DIAGNOSIS — O210 Mild hyperemesis gravidarum: Secondary | ICD-10-CM | POA: Diagnosis not present

## 2018-08-28 DIAGNOSIS — K920 Hematemesis: Secondary | ICD-10-CM | POA: Diagnosis not present

## 2018-08-28 DIAGNOSIS — R1110 Vomiting, unspecified: Secondary | ICD-10-CM

## 2018-08-28 DIAGNOSIS — I10 Essential (primary) hypertension: Secondary | ICD-10-CM | POA: Insufficient documentation

## 2018-08-28 DIAGNOSIS — Z3A11 11 weeks gestation of pregnancy: Secondary | ICD-10-CM | POA: Diagnosis not present

## 2018-08-28 LAB — COMPREHENSIVE METABOLIC PANEL
ALT: 34 U/L (ref 0–44)
AST: 28 U/L (ref 15–41)
Albumin: 4.1 g/dL (ref 3.5–5.0)
Alkaline Phosphatase: 48 U/L (ref 38–126)
Anion gap: 9 (ref 5–15)
BUN: 6 mg/dL (ref 6–20)
CO2: 21 mmol/L — ABNORMAL LOW (ref 22–32)
Calcium: 9.4 mg/dL (ref 8.9–10.3)
Chloride: 107 mmol/L (ref 98–111)
Creatinine, Ser: 0.67 mg/dL (ref 0.44–1.00)
GFR calc Af Amer: >60 mL/min (ref >60)
GFR calc non Af Amer: >60 mL/min (ref >60)
Glucose, Bld: 104 mg/dL — ABNORMAL HIGH (ref 70–99)
Potassium: 3.7 mmol/L (ref 3.5–5.1)
Sodium: 137 mmol/L (ref 135–145)
Total Bilirubin: 0.6 mg/dL (ref 0.3–1.2)
Total Protein: 7.3 g/dL (ref 6.5–8.1)

## 2018-08-28 LAB — URINALYSIS, COMPLETE (UACMP) WITH MICROSCOPIC
Bacteria, UA: NONE SEEN
Bilirubin Urine: NEGATIVE
Glucose, UA: NEGATIVE mg/dL
Hgb urine dipstick: NEGATIVE
Ketones, ur: NEGATIVE mg/dL
Nitrite: NEGATIVE
Protein, ur: NEGATIVE mg/dL
Specific Gravity, Urine: 1.017 (ref 1.005–1.030)
pH: 6 (ref 5.0–8.0)

## 2018-08-28 LAB — CBC
HCT: 38.4 % (ref 36.0–46.0)
Hemoglobin: 12.9 g/dL (ref 12.0–15.0)
MCH: 27.1 pg (ref 26.0–34.0)
MCHC: 33.6 g/dL (ref 30.0–36.0)
MCV: 80.7 fL (ref 80.0–100.0)
Platelets: 305 10*3/uL (ref 150–400)
RBC: 4.76 MIL/uL (ref 3.87–5.11)
RDW: 12.8 % (ref 11.5–15.5)
WBC: 6.9 10*3/uL (ref 4.0–10.5)
nRBC: 0 % (ref 0.0–0.2)

## 2018-08-28 LAB — TYPE AND SCREEN
ABO/RH(D): A POS
Antibody Screen: NEGATIVE

## 2018-08-28 LAB — PROTIME-INR
INR: 1 (ref 0.8–1.2)
Prothrombin Time: 12.8 seconds (ref 11.4–15.2)

## 2018-08-28 LAB — APTT: aPTT: 30 seconds (ref 24–36)

## 2018-08-28 MED ORDER — SODIUM CHLORIDE 0.9 % IV BOLUS
1000.0000 mL | Freq: Once | INTRAVENOUS | Status: AC
  Administered 2018-08-28: 1000 mL via INTRAVENOUS

## 2018-08-28 MED ORDER — ONDANSETRON HCL 4 MG/2ML IJ SOLN
4.0000 mg | Freq: Once | INTRAMUSCULAR | Status: AC
  Administered 2018-08-28: 4 mg via INTRAVENOUS
  Filled 2018-08-28: qty 2

## 2018-08-28 MED ORDER — DEXTROSE-NACL 5-0.45 % IV SOLN
Freq: Once | INTRAVENOUS | Status: AC
  Administered 2018-08-28: 12:00:00 via INTRAVENOUS

## 2018-08-28 NOTE — ED Provider Notes (Signed)
[facility] Emergency Department Provider Note  ____________________________________________   First MD Initiated Contact with Patient 08/28/18 1034     (approximate)  I have reviewed the triage vital signs and the nursing notes.  HISTORY  Chief Complaint Hematemesis    HPI [name] is a 23 y.o. female G1P0 at 5511 weeks, history of clotting disorder, currently on Lovenox who presents to the emergency department for 2 episodes of bloody emesis.  Patient reports vomiting coffee-ground emesis with blood clots, 2 episodes this morning.  She reports multiple other episodes of nonbloody emesis since yesterday.  She has been experiencing problems with morning sickness for the last several weeks, and just had a prescription sent for Zofran by her OB yesterday, but she has not been able to take this yet.  She denies any abdominal pain, no vaginal bleeding, no hematuria.  No chest pain or shortness of breath.         Past Medical Hx Past Medical History:  Diagnosis Date  . Chlamydia 6/15, 11/15, 2/16  . Clotting disorder (HCC) 2012   PROTHROMBIN GENE MUTATION AND fACTOR viii - NO ESTROGEN PRODUCTS  . Esophageal reflux   . Family history of breast cancer 05/08/2013   WILL DISCUSS GENETIC TESTING WHEN PT TURNS 21  . Immunization, viral disease    GARDASIL SERIES COMPLETED  . Irregular menses 03/19/2012  . Menorrhagia 2012   OFF BC  . Mononucleosis 01/08/2014   [facility]  . Pre-diabetes 2014   [facility] IN THE PAST  . Proctalgia    Intermittently  . TIA (transient ischemic attack) 2013    Problem List Patient Active Problem List   Diagnosis Date Noted  . UTI (urinary tract infection) 03/20/2017  . Anxiety and depression 06/16/2016  . Hypertension 02/01/2012  . Insulin resistance 02/01/2012  . Obesity 02/01/2012  . Pancreatitis, acute 02/01/2012  . 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 Esophageal reflux   . Proctalgia     Past  Surgical Hx Past Surgical History:  Procedure Laterality Date  . WISDOM TOOTH EXTRACTION      Medications Prior to Admission medications   Medication Sig Start Date End Date Taking? Authorizing Provider  sertraline (ZOLOFT) 100 MG tablet TAKE 1 TABLET BY MOUTH EVERY DAY 07/13/16   Copland, Alicia B, PA-C    Allergies Estrogens  Family Hx Family History  Problem Relation Age of Onset  . Clotting disorder Mother   . Breast cancer Paternal Aunt   . Hypertension Maternal Grandmother   . Heart disease Maternal Grandfather   . Heart attack Maternal Grandfather     Social Hx Social History   Tobacco Use  . Smoking status: Former Smoker    Types: Cigarettes    Quit date: 09/2015    Years since quitting: 2.9  . Smokeless tobacco: Never Used  Substance Use Topics  . Alcohol use: Yes    Comment: occasionally   . Drug use: No    Review of Systems  Constitutional: No fever. No chills. Eyes: No visual changes. ENT: No sore throat. Cardiovascular: No chest pain. Respiratory: No shortness of breath. Gastrointestinal: Positive for vomiting. Genitourinary: No dysuria. Musculoskeletal: No back pain. Skin: No rash. Neurological: No for headaches.  ____________________________________________   PHYSICAL EXAM:  Vital Signs: ED Triage Vitals [08/28/18 1016]  Enc Vitals Group     BP (!) 146/96     Pulse Rate (!) 115     Resp 18     Temp  98.7 F (37.1 C)     Temp Source Oral     SpO2 98 %     Weight 256 lb (116.1 kg)     Height 5\' 9"  (1.753 m)     Head Circumference      Peak Flow      Pain Score 4     Pain Loc      Pain Edu?      Excl. in [place]?     Constitutional: Alert and oriented.  Eyes: Conjunctivae are normal. Sclera anicteric. Head: Normocephalic. Atraumatic. Nose: No congestion. No rhinorrhea. Mouth/Throat: Mucous membranes are moist.  Neck: No stridor.   Cardiovascular: Mildly tachycardic, regular rhythm. Grossly normal heart sounds.  Good peripheral  perfusion. Respiratory: Normal respiratory effort.  No retractions. Lungs CTAB. Gastrointestinal: Soft and non-tender. No distention.  Musculoskeletal: No lower extremity edema. Neurologic:  Normal speech and language. No gross focal neurologic deficits are appreciated.  Skin: Skin is warm, dry and intact. No rash noted. Psychiatric: Mood and affect are appropriate for situation.  ____________________________________________   INITIAL IMPRESSION / ASSESSMENT AND PLAN / ED COURSE  23 y.o. female G1P0 at 72 weeks, history of clotting disorder, currently on Lovenox who presents to the emergency department for 2 episodes of bloody emesis.  Labs notable for mild tachycardia.  Overall well-appearing.  Bedside ultrasound reveals IUP with fetal heart rate in 160s.  Plan: Labs, fluids, symptom control, touch base with OB.  Patient's heart rate improved with fluids, she is feeling much improved with Zofran and fluids, able to tolerate PO here without issues.  Labs are without actionable derangements.  She has a mildly decreased bicarb which is consistent with her vomiting.  No ketones in the urine.  H&H within normal limits and she has had no further episodes of emesis while in the emergency department.  I discussed her presentation with OB, who states that she should continue the Lovenox due to her clotting disorder, but will expedite her follow-up with MFM for further management of this.  She was seen in the clinic yesterday, and they sent her prescription for Zofran for continued control of her nausea.  Patient is comfortable with this plan of care and discharge.  Discussed strict return precautions, she voices understanding and is comfortable with the plan at discharge.    ____________________________________________   FINAL CLINICAL IMPRESSION(S) / ED DIAGNOSES  Final diagnoses:  Vomiting in adult  Hematemesis with nausea      Note:  This document was prepared using Dragon voice  recognition software and may include unintentional dictation errors.   Lilia Pro., MD 08/28/18 [phone]

## 2018-08-28 NOTE — ED Notes (Signed)
PT resting in bed, RN introduced self. Pt reporting continued nausea and requesting water. MD made aware.

## 2018-08-28 NOTE — ED Notes (Signed)
Unable to doppler FHT 

## 2018-08-28 NOTE — ED Notes (Signed)
Pt resting in bed NAD. Water and crackers left at bedside per PO challenge request by Dr. Joan Mayans. Bed locked in lowest position, connected to monitor.

## 2018-08-28 NOTE — Discharge Instructions (Addendum)
Please take the Zofran as prescribed.  Please follow-up with MFM doctors to further talk about both your nausea management and your anticoagulation management.  Please return to emergency department for any new or worsening symptoms.

## 2018-08-28 NOTE — ED Triage Notes (Signed)
Pt reports that she is [redacted] weeks pregnant with EDD of 03/19/19. PT reports hx of clotting disorder so was placed on lovenox injections for pregnancy. This am pt reports two epiodes of vomiting coffee ground emeisis with a few blood clots in it. Pt denies abd pain pt states that she is having throat pain.

## 2018-08-29 DIAGNOSIS — O0991 Supervision of high risk pregnancy, unspecified, first trimester: Secondary | ICD-10-CM | POA: Diagnosis not present

## 2018-08-29 DIAGNOSIS — N898 Other specified noninflammatory disorders of vagina: Secondary | ICD-10-CM | POA: Diagnosis not present

## 2018-08-30 DIAGNOSIS — R590 Localized enlarged lymph nodes: Secondary | ICD-10-CM | POA: Diagnosis not present

## 2018-08-30 DIAGNOSIS — Z03818 Encounter for observation for suspected exposure to other biological agents ruled out: Secondary | ICD-10-CM | POA: Diagnosis not present

## 2018-08-31 ENCOUNTER — Other Ambulatory Visit: Payer: Self-pay | Admitting: Obstetrics and Gynecology

## 2018-08-31 DIAGNOSIS — Z369 Encounter for antenatal screening, unspecified: Secondary | ICD-10-CM

## 2018-09-06 ENCOUNTER — Other Ambulatory Visit: Payer: Self-pay

## 2018-09-06 ENCOUNTER — Other Ambulatory Visit: Payer: Self-pay | Admitting: Obstetrics and Gynecology

## 2018-09-06 ENCOUNTER — Encounter: Payer: Self-pay | Admitting: Obstetrics and Gynecology

## 2018-09-06 ENCOUNTER — Inpatient Hospital Stay
Admission: AD | Admit: 2018-09-06 | Discharge: 2018-09-07 | DRG: 833 | Disposition: A | Discharge status: Home / Self Care | Payer: BC Managed Care – PPO | Attending: Obstetrics and Gynecology | Admitting: Obstetrics and Gynecology

## 2018-09-06 DIAGNOSIS — Z3A12 12 weeks gestation of pregnancy: Secondary | ICD-10-CM

## 2018-09-06 DIAGNOSIS — O10911 Unspecified pre-existing hypertension complicating pregnancy, first trimester: Secondary | ICD-10-CM | POA: Diagnosis not present

## 2018-09-06 DIAGNOSIS — O211 Hyperemesis gravidarum with metabolic disturbance: Secondary | ICD-10-CM | POA: Diagnosis not present

## 2018-09-06 DIAGNOSIS — O210 Mild hyperemesis gravidarum: Principal | ICD-10-CM | POA: Diagnosis present

## 2018-09-06 DIAGNOSIS — K920 Hematemesis: Secondary | ICD-10-CM | POA: Diagnosis not present

## 2018-09-06 DIAGNOSIS — Z7901 Long term (current) use of anticoagulants: Secondary | ICD-10-CM | POA: Diagnosis not present

## 2018-09-06 DIAGNOSIS — Z20828 Contact with and (suspected) exposure to other viral communicable diseases: Secondary | ICD-10-CM | POA: Diagnosis present

## 2018-09-06 DIAGNOSIS — D66 Hereditary factor VIII deficiency: Secondary | ICD-10-CM | POA: Diagnosis not present

## 2018-09-06 DIAGNOSIS — Z369 Encounter for antenatal screening, unspecified: Secondary | ICD-10-CM

## 2018-09-06 DIAGNOSIS — O0991 Supervision of high risk pregnancy, unspecified, first trimester: Secondary | ICD-10-CM | POA: Diagnosis not present

## 2018-09-06 LAB — COMPREHENSIVE METABOLIC PANEL
ALT: 37 U/L (ref 0–44)
AST: 28 U/L (ref 15–41)
Albumin: 4.7 g/dL (ref 3.5–5.0)
Alkaline Phosphatase: 48 U/L (ref 38–126)
Anion gap: 10 (ref 5–15)
BUN: 8 mg/dL (ref 6–20)
CO2: 20 mmol/L — ABNORMAL LOW (ref 22–32)
Calcium: 9.7 mg/dL (ref 8.9–10.3)
Chloride: 105 mmol/L (ref 98–111)
Creatinine, Ser: 0.66 mg/dL (ref 0.44–1.00)
GFR calc Af Amer: >60 mL/min (ref >60)
GFR calc non Af Amer: >60 mL/min (ref >60)
Glucose, Bld: 93 mg/dL (ref 70–99)
Potassium: 3.4 mmol/L — ABNORMAL LOW (ref 3.5–5.1)
Sodium: 135 mmol/L (ref 135–145)
Total Bilirubin: 0.7 mg/dL (ref 0.3–1.2)
Total Protein: 8 g/dL (ref 6.5–8.1)

## 2018-09-06 LAB — TYPE AND SCREEN
ABO/RH(D): A POS
Antibody Screen: NEGATIVE

## 2018-09-06 LAB — URINALYSIS, ROUTINE W REFLEX MICROSCOPIC
Bacteria, UA: NONE SEEN
Bilirubin Urine: NEGATIVE
Glucose, UA: NEGATIVE mg/dL
Hgb urine dipstick: NEGATIVE
Ketones, ur: 20 mg/dL — AB
Nitrite: NEGATIVE
Protein, ur: NEGATIVE mg/dL
Specific Gravity, Urine: 1.026 (ref 1.005–1.030)
pH: 5 (ref 5.0–8.0)

## 2018-09-06 LAB — ABO/RH: ABO/RH(D): A POS

## 2018-09-06 LAB — CBC
HCT: 40.1 % (ref 36.0–46.0)
Hemoglobin: 13.7 g/dL (ref 12.0–15.0)
MCH: 27.3 pg (ref 26.0–34.0)
MCHC: 34.2 g/dL (ref 30.0–36.0)
MCV: 79.9 fL — ABNORMAL LOW (ref 80.0–100.0)
Platelets: 388 10*3/uL (ref 150–400)
RBC: 5.02 MIL/uL (ref 3.87–5.11)
RDW: 13 % (ref 11.5–15.5)
WBC: 9.5 10*3/uL (ref 4.0–10.5)
nRBC: 0 % (ref 0.0–0.2)

## 2018-09-06 LAB — SARS CORONAVIRUS 2 BY RT PCR (HOSPITAL ORDER, PERFORMED IN ~~LOC~~ HOSPITAL LAB): SARS Coronavirus 2: NEGATIVE

## 2018-09-06 MED ORDER — FAMOTIDINE 20 MG PO TABS
20.0000 mg | ORAL_TABLET | Freq: Two times a day (BID) | ORAL | Status: DC
  Administered 2018-09-06 – 2018-09-07 (×2): 20 mg via ORAL
  Filled 2018-09-06 (×2): qty 1

## 2018-09-06 MED ORDER — ENOXAPARIN SODIUM 40 MG/0.4ML ~~LOC~~ SOLN
40.0000 mg | Freq: Every day | SUBCUTANEOUS | Status: DC
  Administered 2018-09-06: 40 mg via SUBCUTANEOUS
  Filled 2018-09-06: qty 0.4

## 2018-09-06 MED ORDER — THIAMINE HCL 100 MG/ML IJ SOLN
Freq: Once | INTRAVENOUS | Status: AC
  Administered 2018-09-07: 14:00:00 via INTRAVENOUS
  Filled 2018-09-06 (×2): qty 1000

## 2018-09-06 MED ORDER — SODIUM CHLORIDE 0.9 % IV SOLN
8.0000 mg | Freq: Three times a day (TID) | INTRAVENOUS | Status: DC | PRN
  Filled 2018-09-06: qty 4

## 2018-09-06 MED ORDER — LABETALOL HCL 5 MG/ML IV SOLN
20.0000 mg | Freq: Three times a day (TID) | INTRAVENOUS | Status: DC
  Administered 2018-09-06: 20 mg via INTRAVENOUS
  Filled 2018-09-06 (×5): qty 4

## 2018-09-06 MED ORDER — HYDROXYZINE HCL 25 MG PO TABS
50.0000 mg | ORAL_TABLET | Freq: Four times a day (QID) | ORAL | Status: DC | PRN
  Administered 2018-09-06 – 2018-09-07 (×5): 50 mg via ORAL
  Filled 2018-09-06 (×7): qty 2

## 2018-09-06 MED ORDER — LACTATED RINGERS IV BOLUS
250.0000 mL | Freq: Once | INTRAVENOUS | Status: AC
  Administered 2018-09-06: 19:00:00 250 mL via INTRAVENOUS

## 2018-09-06 MED ORDER — HYDROXYZINE HCL 50 MG/ML IM SOLN
50.0000 mg | Freq: Four times a day (QID) | INTRAMUSCULAR | Status: DC | PRN
  Filled 2018-09-06: qty 1

## 2018-09-06 MED ORDER — PRENATAL MULTIVITAMIN CH: 1.0000 | ORAL_TABLET | Freq: Every day | ORAL | Status: DC

## 2018-09-06 MED ORDER — THIAMINE HCL 100 MG/ML IJ SOLN
Freq: Once | INTRAVENOUS | Status: AC
  Administered 2018-09-06: 17:00:00 via INTRAVENOUS
  Filled 2018-09-06: qty 1000

## 2018-09-06 MED ORDER — METOCLOPRAMIDE HCL 10 MG PO TABS
10.0000 mg | ORAL_TABLET | Freq: Four times a day (QID) | ORAL | Status: DC
  Administered 2018-09-06 – 2018-09-07 (×4): 10 mg via ORAL
  Filled 2018-09-06 (×4): qty 1

## 2018-09-06 MED ORDER — DOCUSATE SODIUM 100 MG PO CAPS
100.0000 mg | ORAL_CAPSULE | Freq: Every day | ORAL | Status: DC
  Administered 2018-09-07: 100 mg via ORAL
  Filled 2018-09-06: qty 1

## 2018-09-06 MED ORDER — FAMOTIDINE IN NACL 20-0.9 MG/50ML-% IV SOLN
20.0000 mg | Freq: Two times a day (BID) | INTRAVENOUS | Status: DC
  Filled 2018-09-06 (×4): qty 50

## 2018-09-06 MED ORDER — POTASSIUM CHLORIDE 2 MEQ/ML IV SOLN
INTRAVENOUS | Status: DC
  Administered 2018-09-06 – 2018-09-07 (×3): via INTRAVENOUS
  Filled 2018-09-06 (×7): qty 1000

## 2018-09-06 MED ORDER — ZOLPIDEM TARTRATE 5 MG PO TABS: 5.0000 mg | ORAL_TABLET | Freq: Every evening | ORAL | Status: DC | PRN

## 2018-09-06 MED ORDER — ACETAMINOPHEN 325 MG PO TABS: 650.0000 mg | ORAL_TABLET | ORAL | Status: DC | PRN

## 2018-09-06 MED ORDER — ENOXAPARIN SODIUM 40 MG/0.4ML ~~LOC~~ SOLN: 40.0000 mg | SUBCUTANEOUS | Status: DC

## 2018-09-06 MED ORDER — ONDANSETRON HCL 4 MG/2ML IJ SOLN: 4.0000 mg | Freq: Three times a day (TID) | INTRAMUSCULAR | Status: DC | PRN

## 2018-09-06 MED ORDER — PROMETHAZINE HCL 25 MG PO TABS
12.5000 mg | ORAL_TABLET | ORAL | Status: DC | PRN
  Filled 2018-09-06: qty 1

## 2018-09-06 MED ORDER — CALCIUM CARBONATE ANTACID 500 MG PO CHEW: 2.0000 | CHEWABLE_TABLET | ORAL | Status: DC | PRN

## 2018-09-06 MED ORDER — METOCLOPRAMIDE HCL 5 MG/ML IJ SOLN
10.0000 mg | Freq: Four times a day (QID) | INTRAMUSCULAR | Status: DC
  Administered 2018-09-06: 10 mg via INTRAVENOUS
  Filled 2018-09-06: qty 2

## 2018-09-06 MED ORDER — ONDANSETRON 4 MG PO TBDP: 4.0000 mg | ORAL_TABLET | Freq: Three times a day (TID) | ORAL | Status: DC | PRN

## 2018-09-06 MED ORDER — VITAMIN B-6 50 MG PO TABS
25.0000 mg | ORAL_TABLET | Freq: Two times a day (BID) | ORAL | Status: DC
  Administered 2018-09-06 – 2018-09-07 (×2): 25 mg via ORAL
  Filled 2018-09-06 (×4): qty 0.5

## 2018-09-06 MED ORDER — PROMETHAZINE HCL 25 MG RE SUPP: 12.5000 mg | RECTAL | Status: DC | PRN

## 2018-09-06 NOTE — H&P (Signed)
[name] is a 23 y.o. female here for Emesis .here for hyperemesis worsening 3 days . + hematemesis x2 for last week  Unable to keep solid/ liquids down .using po Zofran  b6 without relief .  On Lovenox 40 mg daily sq for Prothrombin gene defect . HTN currently off meds  . G1P0 at 12+2 by LMP and confirmatory 7+6 wk u/s    Past Medical History:  has a past medical history of Anxiety, Chlamydia, Coffee ground emesis (09/06/2018), Depression, Dysmenorrhea, Elevated LFTs (11/2017), Factor VIII deficiency (CMS-HCC) (2014), Hypertension, Irregular menses, unspecified, Menorrhagia, Mixed hyperlipidemia, Obesity (BMI 30-39.9), unspecified, Pancreatitis (2012), Prothrombin gene mutation (CMS-HCC), Vision abnormalities, and Vitamin D deficiency (11/2017).  Past Surgical History:  has a past surgical history that includes Wisdom Teeth (2013). Family History: family history includes Breast cancer in her paternal aunt; Clotting disorder in her mother; Drug abuse in her maternal uncle; Heart disease in her maternal grandfather and maternal uncle; High blood pressure (Hypertension) in her maternal aunt and maternal grandmother; Kidney anomalies in her half-sister; Myocardial Infarction (Heart attack) in her maternal grandfather and maternal uncle; No Known Problems in her father, half-brother, half-sister, and half-sister; Other in her mother; Skin cancer in her maternal grandmother; Stroke in her maternal grandfather. Social History:  reports that she has quit smoking. Her smoking use included cigarettes. She has never used smokeless tobacco. She reports previous alcohol use. She reports that she does not use drugs. OB/GYN History:          OB History    Gravida  1   Para  0   Term  0   Preterm  0   AB  0   Living  0     SAB  0   TAB  0   Ectopic  0   Molar  0   Multiple  0   Live Births  0          Allergies: is allergic to estrogens and lovenox  [enoxaparin]. Medications:  Current Outpatient Medications:  .  aspirin 81 MG EC tablet, Take 1 tablet (81 mg total) by mouth once daily, Disp: 90 tablet, Rfl: 2 .  blood pressure test kit-large Kit, To monitor blood pressure at home during pregnancy., Disp: 1 each, Rfl: 0 .  blood pressure test kit-large Kit, Use 1 each 2 (two) times daily, Disp: 1 each, Rfl: 0 .  cholecalciferol (VITAMIN D3) 1,000 unit capsule, Take 1,000 Units by mouth once a week  , Disp: , Rfl:  .  citalopram (CELEXA) 20 MG tablet, Take 1 tablet (20 mg total) by mouth once daily, Disp: 90 tablet, Rfl: 3 .  doxylamine succinate (UNISOM) 25 mg tablet, Take 0.5 tablets (12.5 mg total) by mouth 3 (three) times a day for 30 days, Disp: 30 tablet, Rfl: 0 .  enoxaparin (LOVENOX) 40 mg/0.4 mL injection syringe, Inject 0.4 mLs (40 mg total) subcutaneously once daily, Disp: 90 Syringe, Rfl: 2 .  PNV no.153-[id number]-dha-epa-fish 400 mcg-35 mg- 25 mg-5 mg Chew, Take by mouth, Disp: , Rfl:  .  pyridoxine, vitamin B6, (VITAMIN B-6) 25 MG tablet, Take 1 tablet (25 mg total) by mouth 3 (three) times a day, Disp: 90 tablet, Rfl: 1 .  niFEdipine (PROCARDIA-XL) 60 MG (OSM) XL tablet, Take 1 tablet (60 mg total) by mouth once daily Patient needs an appointment for further refills (Patient not taking: Reported on 09/06/2018  ), Disp: 90 tablet, Rfl: 0 .  promethazine (PHENERGAN) 25 MG suppository,  Place 1 suppository (25 mg total) rectally every 6 (six) hours as needed for Nausea (Patient not taking: Reported on 08/30/2018  ), Disp: 20 suppository, Rfl: 1  Review of Systems: General:                      No fatigue or weight loss Eyes:                           No vision changes Ears:                            No hearing difficulty Respiratory:                No cough or shortness of breath Pulmonary:                  No asthma or shortness of breath Cardiovascular:           No chest pain, palpitations, dyspnea on exertion Gastrointestinal:           No abdominal bloating, chronic diarrhea, constipations, masses, pain or hematochezia, + n/v , + coffee ground emesis Genitourinary:             No hematuria, dysuria, abnormal vaginal discharge, pelvic pain, Menometrorrhagia Lymphatic:                   No swollen lymph nodes Musculoskeletal:         No muscle weakness Neurologic:                  No extremity weakness, syncope, seizure disorder Psychiatric:                  No history of depression, delusions or suicidal/homicidal ideation    Exam:      Vitals:   09/06/18 1455  BP: (!) 141/98    Body mass index is 37.66 kg/m.  WDWN white/ female , ill appearing with emesis bag by face .  Lungs: CTA  CV : RRR without murmur    Neck:  no thyromegaly Abdomen: soft , no mass, normal active bowel sounds,  non-tender, no rebound tenderness + DT 155   Impression:   The primary encounter diagnosis was Mild hyperemesis gravidarum, antepartum. Diagnoses of Chronic hypertension affecting pregnancy and Coffee ground emesis were also pertinent to this visit.    Plan:   Admission to [facility] for IVF hydration and antiemetic meds .  Continue on Lovenox , ASA,  add SCD Restart labetalol 20 mg IV tid  Until she can be converted to PO meds . ( She didn't tolerate Procardia  Due to low HR )   GI in patient consult          Caroline Sauger, MD       Electronically signed by Caroline Sauger, MD on 09/06/2018 3:28 PM

## 2018-09-07 ENCOUNTER — Encounter: Payer: Self-pay | Admitting: Certified Nurse Midwife

## 2018-09-07 LAB — COMPREHENSIVE METABOLIC PANEL
ALT: 27 U/L (ref 0–44)
AST: 19 U/L (ref 15–41)
Albumin: 3.4 g/dL — ABNORMAL LOW (ref 3.5–5.0)
Alkaline Phosphatase: 40 U/L (ref 38–126)
Anion gap: 7 (ref 5–15)
BUN: 5 mg/dL — ABNORMAL LOW (ref 6–20)
CO2: 23 mmol/L (ref 22–32)
Calcium: 9.1 mg/dL (ref 8.9–10.3)
Chloride: 107 mmol/L (ref 98–111)
Creatinine, Ser: 0.57 mg/dL (ref 0.44–1.00)
GFR calc Af Amer: >60 mL/min (ref >60)
GFR calc non Af Amer: >60 mL/min (ref >60)
Glucose, Bld: 83 mg/dL (ref 70–99)
Potassium: 3.7 mmol/L (ref 3.5–5.1)
Sodium: 137 mmol/L (ref 135–145)
Total Bilirubin: 0.6 mg/dL (ref 0.3–1.2)
Total Protein: 6.4 g/dL — ABNORMAL LOW (ref 6.5–8.1)

## 2018-09-07 MED ORDER — METOCLOPRAMIDE HCL 10 MG PO TABS: 10.0000 mg | ORAL_TABLET | Freq: Four times a day (QID) | ORAL | 1 refills | Status: DC

## 2018-09-07 MED ORDER — ENOXAPARIN SODIUM 40 MG/0.4ML ~~LOC~~ SOLN: 40.0000 mg | Freq: Every day | SUBCUTANEOUS | 8 refills | Status: DC

## 2018-09-07 MED ORDER — ACETAMINOPHEN 325 MG PO TABS: 650.0000 mg | ORAL_TABLET | ORAL | 0 refills | Status: DC | PRN

## 2018-09-07 MED ORDER — PROMETHAZINE HCL 12.5 MG PO TABS: 12.5000 mg | ORAL_TABLET | ORAL | 0 refills | Status: DC | PRN

## 2018-09-07 MED ORDER — FAMOTIDINE 20 MG PO TABS: 20.0000 mg | ORAL_TABLET | Freq: Two times a day (BID) | ORAL | 8 refills | Status: DC

## 2018-09-07 MED ORDER — ONDANSETRON 4 MG PO TBDP: 4.0000 mg | ORAL_TABLET | Freq: Three times a day (TID) | ORAL | 1 refills | Status: DC

## 2018-09-07 MED ORDER — PYRIDOXINE HCL 50 MG PO TABS: 50.0000 mg | ORAL_TABLET | Freq: Three times a day (TID) | ORAL | 1 refills | Status: DC

## 2018-09-07 NOTE — Discharge Summary (Signed)
Patient ID: [name] MRN: [id number] DOB/AGE: 1995/10/18 23 y.o.  Admit date: 09/06/2018 Discharge date: 09/07/2018  Admission Diagnoses: Hyperemesis gravidarum  Discharge Diagnoses: Hyperemesis gravidarum   Prenatal Procedures: none  Significant Diagnostic Studies:  Results for orders placed or performed during the hospital encounter of 09/06/18 (from the past 168 hour(s))  CBC   Collection Time: 09/06/18  4:17 PM  Result Value Ref Range   WBC 9.5 4.0 - 10.5 K/uL   RBC 5.02 3.87 - 5.11 MIL/uL   Hemoglobin 13.7 12.0 - 15.0 g/dL   HCT 40.1 36.0 - 46.0 %   MCV 79.9 (L) 80.0 - 100.0 fL   MCH 27.3 26.0 - 34.0 pg   MCHC 34.2 30.0 - 36.0 g/dL   RDW 13.0 11.5 - 15.5 %   Platelets 388 150 - 400 K/uL   nRBC 0.0 0.0 - 0.2 %  Comprehensive metabolic panel   Collection Time: 09/06/18  4:17 PM  Result Value Ref Range   Sodium 135 135 - 145 mmol/L   Potassium 3.4 (L) 3.5 - 5.1 mmol/L   Chloride 105 98 - 111 mmol/L   CO2 20 (L) 22 - 32 mmol/L   Glucose, Bld 93 70 - 99 mg/dL   BUN 8 6 - 20 mg/dL   Creatinine, Ser 0.66 0.44 - 1.00 mg/dL   Calcium 9.7 8.9 - 10.3 mg/dL   Total Protein 8.0 6.5 - 8.1 g/dL   Albumin 4.7 3.5 - 5.0 g/dL   AST 28 15 - 41 U/L   ALT 37 0 - 44 U/L   Alkaline Phosphatase 48 38 - 126 U/L   Total Bilirubin 0.7 0.3 - 1.2 mg/dL   GFR calc non Af Amer >60 >60 mL/min   GFR calc Af Amer >60 >60 mL/min   Anion gap 10 5 - 15  Type and screen   Collection Time: 09/06/18  4:17 PM  Result Value Ref Range   ABO/RH(D) A POS    Antibody Screen NEG    Sample Expiration      09/09/2018,2359 Performed at [facility], [address] [place], [place] [address]   ABO/Rh   Collection Time: 09/06/18  4:24 PM  Result Value Ref Range   ABO/RH(D)      A POS Performed at [facility], [address] [place], [place] [address]   SARS Coronavirus 2 [facility] order, Performed in [place] hospital lab) Nasopharyngeal Nasopharyngeal Swab   Collection  Time: 09/06/18  4:47 PM   Specimen: Nasopharyngeal Swab  Result Value Ref Range   SARS Coronavirus 2 NEGATIVE NEGATIVE  Urinalysis, Routine w reflex microscopic   Collection Time: 09/06/18  6:34 PM  Result Value Ref Range   Color, Urine YELLOW (A) YELLOW   APPearance HAZY (A) CLEAR   Specific Gravity, Urine 1.026 1.005 - 1.030   pH 5.0 5.0 - 8.0   Glucose, UA NEGATIVE NEGATIVE mg/dL   Hgb urine dipstick NEGATIVE NEGATIVE   Bilirubin Urine NEGATIVE NEGATIVE   Ketones, ur 20 (A) NEGATIVE mg/dL   Protein, ur NEGATIVE NEGATIVE mg/dL   Nitrite NEGATIVE NEGATIVE   Leukocytes,Ua MODERATE (A) NEGATIVE   RBC / HPF 0-5 0 - 5 RBC/hpf   WBC, UA 0-5 0 - 5 WBC/hpf   Bacteria, UA NONE SEEN NONE SEEN   Squamous Epithelial / LPF 0-5 0 - 5   Mucus PRESENT   Comprehensive metabolic panel   Collection Time: 09/07/18  7:07 AM  Result Value Ref Range  Sodium 137 135 - 145 mmol/L   Potassium 3.7 3.5 - 5.1 mmol/L   Chloride 107 98 - 111 mmol/L   CO2 23 22 - 32 mmol/L   Glucose, Bld 83 70 - 99 mg/dL   BUN 5 (L) 6 - 20 mg/dL   Creatinine, Ser 1.610.57 0.44 - 1.00 mg/dL   Calcium 9.1 8.9 - 09.610.3 mg/dL   Total Protein 6.4 (L) 6.5 - 8.1 g/dL   Albumin 3.4 (L) 3.5 - 5.0 g/dL   AST 19 15 - 41 U/L   ALT 27 0 - 44 U/L   Alkaline Phosphatase 40 38 - 126 U/L   Total Bilirubin 0.6 0.3 - 1.2 mg/dL   GFR calc non Af Amer >60 >60 mL/min   GFR calc Af Amer >60 >60 mL/min   Anion gap 7 5 - 15    Treatments: IV hydration, IV antiemetics  Hospital Course:  This is a 23 y.o. G1P0000 with IUP at 3729w3d admitted for hyperemesis and inability to keep solids and liquids down for a number of days. She was treated with IVF, famotidine, metoclopramide, and hydroxyzine. She was also continued on Lovenox for prothrombin gene defect. Her nausea and vomiting improved, she tolerated clear fluids, and then tolerated a regular diet. She was grossly normotensive so antihypertensives were held. She had no vaginal bleeding, cramping,  or abnormal vaginal discharge. She was deemed stable for discharge to home with outpatient follow up.  Discharge Physical Exam:  BP 107/67 (BP Location: Right Arm)   Pulse 93   Temp 98.2 F (36.8 C)   Resp 18   LMP 06/12/2018   SpO2 100%   General:   alert, cooperative, appears stated age and no distress  Skin:  normal and no rash or abnormalities  Neurologic:    Alert & oriented x 3  Lungs:   clear to auscultation bilaterally  Heart:   regular rate and rhythm, S1, S2 normal, no murmur, click, rub or gallop  Abdomen:  soft, non-tender; bowel sounds normal; no masses,  no organomegaly  Pelvis:  Exam deferred.  FHT:  165 BPM  Extremities: : non-tender, symmetric, no edema bilaterally.     Discharge Condition: Stable  Disposition: Discharge disposition: 01-Home or Self Care        Allergies as of 09/07/2018      Reactions   Estrogens Other (See Comments)   Hx clotting disorder      Medication List    TAKE these medications   acetaminophen 325 MG tablet Commonly known as: TYLENOL Take 2 tablets (650 mg total) by mouth every 4 (four) hours as needed for mild pain or moderate pain.   enoxaparin 40 MG/0.4ML injection Commonly known as: LOVENOX Inject 0.4 mLs (40 mg total) into the skin at bedtime.   famotidine 20 MG tablet Commonly known as: PEPCID Take 1 tablet (20 mg total) by mouth every 12 (twelve) hours.   metoCLOPramide 10 MG tablet Commonly known as: REGLAN Take 1 tablet (10 mg total) by mouth every 6 (six) hours. Start taking on: September 08, 2018   ondansetron 4 MG disintegrating tablet Commonly known as: ZOFRAN-ODT Take 1 tablet (4 mg total) by mouth every 8 (eight) hours.   promethazine 12.5 MG tablet Commonly known as: PHENERGAN Take 1-2 tablets (12.5-25 mg total) by mouth every 4 (four) hours as needed for nausea or vomiting.   pyridOXINE 50 MG tablet Commonly known as: B-6 Take 1 tablet (50 mg total) by mouth 4 (four) times daily -  with meals  and at bedtime.   sertraline 100 MG tablet Commonly known as: ZOLOFT TAKE 1 TABLET BY MOUTH EVERY DAY        Signed:  [name] 09/07/2018 7:02 PM ----- [name], CNM Certified Nurse Midwife [facility]

## 2018-09-10 ENCOUNTER — Telehealth: Payer: Self-pay | Admitting: Obstetrics and Gynecology

## 2018-09-10 NOTE — Telephone Encounter (Signed)
[name] is referred to [facility] on 09/13/2018. She is scheduled for telephone genetic counseling to review the results of her MaterniT21 testing drawn at [facility] at 11am.  She will then be seen in clinic for an ultrasound at 2pm followed by a Maternal Fetal Medicine consultation due to her history of a Prothrombin gene mutation and for labs as needed.  We may be reached at [phone] with any questions.  Wilburt Finlay, MS, CGC

## 2018-09-12 DIAGNOSIS — Z113 Encounter for screening for infections with a predominantly sexual mode of transmission: Secondary | ICD-10-CM | POA: Diagnosis not present

## 2018-09-12 DIAGNOSIS — I10 Essential (primary) hypertension: Secondary | ICD-10-CM | POA: Diagnosis not present

## 2018-09-12 DIAGNOSIS — D66 Hereditary factor VIII deficiency: Secondary | ICD-10-CM | POA: Diagnosis not present

## 2018-09-12 DIAGNOSIS — D6852 Prothrombin gene mutation: Secondary | ICD-10-CM | POA: Diagnosis not present

## 2018-09-12 DIAGNOSIS — R1110 Vomiting, unspecified: Secondary | ICD-10-CM | POA: Diagnosis not present

## 2018-09-13 ENCOUNTER — Ambulatory Visit
Admission: RE | Admit: 2018-09-13 | Discharge: 2018-09-13 | Disposition: A | Discharge status: Home / Self Care | Payer: BC Managed Care – PPO | Source: Ambulatory Visit | Attending: Maternal & Fetal Medicine | Admitting: Maternal & Fetal Medicine

## 2018-09-13 ENCOUNTER — Other Ambulatory Visit: Payer: Self-pay

## 2018-09-13 ENCOUNTER — Ambulatory Visit (HOSPITAL_BASED_OUTPATIENT_CLINIC_OR_DEPARTMENT_OTHER)
Admission: RE | Admit: 2018-09-13 | Discharge: 2018-09-13 | Disposition: A | Discharge status: Home / Self Care | Payer: BC Managed Care – PPO | Source: Ambulatory Visit | Attending: Maternal & Fetal Medicine | Admitting: Maternal & Fetal Medicine

## 2018-09-13 VITALS — BP 142/85 | Temp 98.7°F | Resp 18 | Ht 69.6 in | Wt 251.5 lb

## 2018-09-13 DIAGNOSIS — O10912 Unspecified pre-existing hypertension complicating pregnancy, second trimester: Secondary | ICD-10-CM | POA: Insufficient documentation

## 2018-09-13 DIAGNOSIS — Z7901 Long term (current) use of anticoagulants: Secondary | ICD-10-CM | POA: Insufficient documentation

## 2018-09-13 DIAGNOSIS — Z36 Encounter for antenatal screening for chromosomal anomalies: Secondary | ICD-10-CM | POA: Diagnosis not present

## 2018-09-13 DIAGNOSIS — K219 Gastro-esophageal reflux disease without esophagitis: Secondary | ICD-10-CM | POA: Insufficient documentation

## 2018-09-13 DIAGNOSIS — Z818 Family history of other mental and behavioral disorders: Secondary | ICD-10-CM

## 2018-09-13 DIAGNOSIS — I10 Essential (primary) hypertension: Secondary | ICD-10-CM

## 2018-09-13 DIAGNOSIS — O99214 Obesity complicating childbirth: Secondary | ICD-10-CM | POA: Diagnosis not present

## 2018-09-13 DIAGNOSIS — O99612 Diseases of the digestive system complicating pregnancy, second trimester: Secondary | ICD-10-CM | POA: Insufficient documentation

## 2018-09-13 DIAGNOSIS — Z79899 Other long term (current) drug therapy: Secondary | ICD-10-CM | POA: Diagnosis not present

## 2018-09-13 DIAGNOSIS — Z369 Encounter for antenatal screening, unspecified: Secondary | ICD-10-CM | POA: Insufficient documentation

## 2018-09-13 DIAGNOSIS — Z87891 Personal history of nicotine dependence: Secondary | ICD-10-CM | POA: Insufficient documentation

## 2018-09-13 DIAGNOSIS — E669 Obesity, unspecified: Secondary | ICD-10-CM | POA: Insufficient documentation

## 2018-09-13 DIAGNOSIS — Z3A13 13 weeks gestation of pregnancy: Secondary | ICD-10-CM | POA: Insufficient documentation

## 2018-09-13 DIAGNOSIS — D6852 Prothrombin gene mutation: Secondary | ICD-10-CM

## 2018-09-13 DIAGNOSIS — O99212 Obesity complicating pregnancy, second trimester: Secondary | ICD-10-CM

## 2018-09-13 NOTE — Progress Notes (Signed)
Virtual Visit via Telephone Note  I connected with [name] on _0 @ at 11:00 AM EDT by telephone and verified that I am speaking with the correct person using two identifiers.  Referring physician:  [facility] Ob/[facility] Length of consultation:  60 minutes  [name]  was referred to [place] for genetic counseling, MFM consultation and ultrasound.  She was seen for genetic counseling to discuss the results of her recent cell free fetal DNA testing which were nonreportable due to low fetal fraction.  She also met with MFM due to her history of a prothrombin gene mutation, hypertension and obesity. See that note for details.  This note summarizes the information from the genetic counseling visit.    The patient previously had MaterniT21 testing drawn through her OB and [facility].  The results were nonreportable due to insufficient DNA in the sample.  We reviewed this type of screening and possible reasons for low fetal fraction. This test utilizes a maternal blood sample and DNA sequencing technology to isolate circulating cell free fetal DNA from maternal plasma.  The fetal DNA can then be analyzed for DNA sequences that are derived from the three most common chromosomes involved in aneuploidy, chromosomes 13, 18, and 21.  If the overall amount of DNA is greater than the expected level for any of these chromosomes, aneuploidy is suspected.  The detection rates are >99% for Down syndrome, >98% for trisomy 18 and >91% for Trisomy 13.  While we do not consider it a replacement for invasive testing and karyotype analysis, a negative result from this testing would be reassuring, though not a guarantee of a normal chromosome complement for the baby.  An abnormal result may be suggestive of an abnormal chromosome complement, though we would still recommend CVS or amniocentesis to confirm any findings from this testing.  Low fetal fraction (typically less than 3-4%  depending upon the lab) can occur for several reasons including early gestational age, increased maternal BMI, chromosome aneuploidy in the fetus, maternal autoimmune disorders and certain medications including anticoagulants.  For [name], we reviewed that her weight as well as the Lovenox may be playing a factor in the amount of fetal DNA in the sample based upon available data.  However, there is also data to suggest an increased risk for a chromosome condition in the fetus when the fetal fraction is low.  We offered the following options:  Repeat MaterniT21 testing.    Ultrasound can be used to assess fetal growth and development.  An ultrasound at the time of this visit ([redacted] weeks gestation) can be helpful to look for cystic hygroma or other anomalies which may be suggestive of a chromosome conditions.  A detailed anatomy ultrasound at [redacted] weeks gestation could also be helpful to evaluation for structural anomalies or soft markers which may be concerning.  The chorionic villus sampling procedure is available for first trimester chromosome analysis.  This involves the withdrawal of a small amount of chorionic villi (tissue from the developing placenta).  Risk of pregnancy loss is estimated to be approximately 1 in 200 to 1 in 100 (0.5 to 1%).  There is approximately a 1% (1 in 100) chance that the CVS chromosome results will be unclear.  Chorionic villi cannot be tested for neural tube defects.     Amniocentesis involves the removal of a small amount of amniotic fluid from the sac surrounding the fetus with the use of a thin needle inserted through  the maternal abdomen and uterus.  Ultrasound guidance is used throughout the procedure.  Fetal cells from amniotic fluid are directly evaluated and > 99.5% of chromosome problems and > 98% of open neural tube defects can be detected. This procedure is generally performed after the 15th week of pregnancy.  The main risks to this procedure include complications  leading to miscarriage in less than 1 in 200 cases (0.5%).  Cystic Fibrosis and Spinal Muscular Atrophy (SMA) screening were also discussed with the patient. Both conditions are recessive, which means that both parents must be carriers in order to have a child with the disease.  Cystic fibrosis (CF) is one of the most common genetic conditions in persons of Caucasian ancestry.  This condition occurs in approximately 1 in 2,500 Caucasian persons and results in thickened secretions in the lungs, digestive, and reproductive systems.  For a baby to be at risk for having CF, both of the parents must be carriers for this condition.  Approximately 1 in 46 Caucasian persons is a carrier for CF.  Current carrier testing looks for the most common mutations in the gene for CF and can detect approximately 90% of carriers in the Caucasian population.  This means that the carrier screening can greatly reduce, but cannot eliminate, the chance for an individual to have a child with CF.  If an individual is found to be a carrier for CF, then carrier testing would be available for the partner. As part of [place] newborn screening profile, all babies born in the state of [place] will have a two-tier screening process.  Specimens are first tested to determine the concentration of immunoreactive trypsinogen (IRT).  The top 5% of specimens with the highest IRT values then undergo DNA testing using a panel of over 40 common CF mutations. SMA is a neurodegenerative disorder that leads to atrophy of skeletal muscle and overall weakness.  This condition is also more prevalent in the Caucasian population, with 1 in 40-1 in 60 persons being a carrier and 1 in 6,000-1 in 10,000 children being affected.  There are multiple forms of the disease, with some causing death in infancy to other forms with survival into adulthood.  The genetics of SMA is complex, but carrier screening can detect up to 95% of carriers in the Caucasian  population.  Similar to CF, a negative result can greatly reduce, but cannot eliminate, the chance to have a child with SMA. The patient declined carrier screening for CF and SMA.  We talked about the option of signing up for Early Check to have the baby tested for SMA after delivery as part of a new study in [place].  This registration can be done online prior to delivery if desired. We also offered the option of carrier screening for hemoglobinopathies.  We obtained a detailed family history and pregnancy history.  This is the first pregnancy for [name] and her partner.  She reported no complications or exposures to alcohol, tobacco or recreational drugs that would be expect to increase the risk for birth defects.  She is on multiple medications including a baby aspirin daily, lovenox, labetalol, and nausea medications.  These are all prescribed by her OB and were reviewed at the MFM consultation today.  The would be considered recommended due to her medical history.  The family history was reported to be unremarkable for birth defects, intellectual delays, recurrent pregnancy loss or known chromosome abnormalities. She was unsure about taking other medications such as celexa  due to concerns about effects on the pregnancy and has chosen not to take those at this time.  We encouraged her to talk with her OB about concerns as she weighs out the risks/benefits for herself.  In the family history, she reported that she and her mother have a Prothrombin gene mutation which was diagnosed after her mother experience recurrent miscarriages. We reviewed the dominant inheritance of this gene change and the 50% chance for passing this on to each of her children.  While testing minors for clotting disorders is not currently recommended, we would suggest letting the pediatrician know of this history in case of unexpected trauma, surgeries or other risk factors.  See MFM note for recommendations for her health.  There is  also a paternal first cousin (fathers sister's son) with autism.  We reviewed that autism the specific cause fo autism is not well understood, however, it is often thought to be inherited in a mutifactorial manner, or due to a combination of genetic factors and environmental factors, or a characteristic of an underlying genetic condition that follows a traditional Mendelian inheritance pattern.  A proposed mechanism for autism is that an affected individual inherits changes in one or more genes associated with susceptibility for autism, and then undergoes some exposure or change during development that causes development of the symptoms of autism.  The suspected underlying genetic changes that predispose an individual to autism are complex, and not completely understood at this time.  A formal genetics evaluation could be helpful to determine if there is a known underlying genetic syndrome as the cause.Without a known cause, recurrence risk estimates for 4th degree relatives are difficult, but would be expected to be low.  We did discuss the option of screening for Fragile X syndrome, which is one of the most common causes of inherited intellectual disability in males, and may present with symptoms of autism.  Fragile X syndrome is caused by a change, or expansion, of the DNA in the gene FMR1.  Carrier screening is used to determine the number of repeats in this region of the gene and to determine the likelihood of having a child with this condition.  A negative result would mean that the individual undergoing carrier testing is not at risk for having a child with Fragile X, but cannot exclude other causes for the autism in her cousin.  In some situations, results from this testing may be inconclusive.   After consideration of the options, [name] elected to have [place] with SCA repeated on 09/13/18 at [facility].  She also elected to have carrier screening for CF, SMA, hemoglobinopathies  and Fragile X syndrome.  All were drawn at this visit. Results will be communicated to the patient when they become available, usually in 1-2 weeks.  The patient was encouraged to call with questions or concerns.  We can be contacted at [phone].  Labs ordered: MaterniT21 PLUS with SCA, Inheritest Core (CF, SMA, Fragile X), Hemoglobinopathy screening  Wilburt Finlay, MS, CGC  I provided 60 minutes of non-face-to-face time during this encounter.   Donette Larry

## 2018-09-13 NOTE — Progress Notes (Signed)
[place] Consultation    HPI: [name] is a 23 y.o. G1P0000 at 45w2dby [facility] dates c/w 7w 6d. 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  She was tested for thrombophilias in high school due to her mother having history of prothrombin gene mutation detected during a work up for recurrent pregnancy loss).  There is no history of VTE in her mom or personal history of VTE.  She was diagnosed with cHTN in high school and reports was told due to weight--she has been heavy since childhood.    Past Medical History: Patient  has a past medical history of Chlamydia (6/15, 11/15, 2/16), [place] disorder [place]), Esophageal reflux, Family history of breast cancer (05/08/2013), Immunization, viral disease, Irregular menses (03/19/2012), Menorrhagia (2012), Mononucleosis (01/08/2014), Pre-diabetes (2014), Proctalgia, and TIA (transient ischemic attack) (2013).  Past Surgical History: She  has a past surgical history that includes Wisdom tooth extraction.  Obstetric History:  OB History    Gravida  1   Para  0   Term  0   Preterm  0   AB  0   Living  0     SAB  0   TAB  0   Ectopic  0   Multiple  0   Live Births  0          Gynecologic History:  Patient's last menstrual period was 06/12/2018.  Previously used DMPA for heavy menses--gained 100lb so does not prefer this method Previous h/o STI--chlamydia  Current Outpatient Medications on File Prior to Encounter  Medication Sig Dispense Refill  . enoxaparin (LOVENOX) 40 MG/0.4ML injection Inject 0.4 mLs (40 mg total) into the skin at bedtime. 12 mL 8  . famotidine (PEPCID) 20 MG tablet Take 1 tablet (20 mg total) by mouth every 12 (twelve) hours. 60 tablet 8  . pyridOXINE (B-6) 50 MG tablet Take 1 tablet (50 mg total) by mouth 4 (four)  times daily -  with meals and at bedtime. 120 tablet 1  . acetaminophen (TYLENOL) 325 MG tablet Take 2 tablets (650 mg total) by mouth every 4 (four) hours as needed for mild pain or moderate pain. (Patient not taking: Reported on 09/13/2018) 60 tablet 0  . metoCLOPramide (REGLAN) 10 MG tablet Take 1 tablet (10 mg total) by mouth every 6 (six) hours. (Patient not taking: Reported on 09/13/2018) 120 tablet 1  . ondansetron (ZOFRAN-ODT) 4 MG disintegrating tablet Take 1 tablet (4 mg total) by mouth every 8 (eight) hours. (Patient not taking: Reported on 09/13/2018) 90 tablet 1  . promethazine (PHENERGAN) 12.5 MG tablet Take 1-2 tablets (12.5-25 mg total) by mouth every 4 (four) hours as needed for nausea or vomiting. (Patient not taking: Reported on 09/13/2018) 60 tablet 0  . sertraline (ZOLOFT) 100 MG tablet TAKE 1 TABLET BY MOUTH EVERY DAY (Patient not taking: Reported on 09/13/2018) 30 tablet 0   No current facility-administered medications on file prior to encounter.     Allergies:NKDA Social History: Patient  reports that she quit smoking about 2 years ago. Her smoking use included cigarettes. She has never used smokeless tobacco. She reports current alcohol use. She reports that she does not use drugs.   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   Family History: family history includes Breast cancer  in her paternal aunt; Clotting disorder in her mother; Heart attack in her maternal grandfather; Heart disease in her maternal grandfather; Hypertension in her maternal grandmother.    Physical Exam: BP (!) 142/85 (BP Location: Right Arm)   Temp 98.7 F (37.1 C) (Oral)   Resp 18   Ht 5' 9.6" (1.768 m)   Wt 114.1 kg   LMP 06/12/2018   SpO2 97%   BMI 36.50 kg/m    Asessement//Plan:  1. Hypertension, chronic.  She reports providers felt her HTN was related to her weight and family history. We reviewed concerns related to cHTN in pregnancy including risks  for growth restriction and preeclampsia. She expressed familiarity with these risks and is currently taking low dose baby aspirin daily to reduce the risk for preeclampsia. The condition (preeclampsia) affects approximately 5-7% of pregnancies, occurs only during pregnancy,  and is characterized by hypertension and new onset proteinuria that typically begins later in pregnancy (after the second trimester).  Untreated preeclampsia can lead to severe complications for mother (seizures, liver/kidney failure, stroke) and fetal demise.  The only treatment is delivery.  If the preeclampsia begins preterm, it can be an indication for preterm delivery.   Recommendations: --continue labetalol 200 bid --she reported she is planning to receive BP cuff ----recommend baseline EKG, CMP, CBC, P:C ratio --continue low dose 'baby' aspirin throughout pregnancy --recommend serial growth assessments monthly after 28 weeks --recommend antenatal testing weekly starting at 34 weeks, sooner if clinically indicated --delivery in 39th week, unless indicated sooner.   2. Prothrombin G 20210 heterozygosity and Factor 8 elevation (per patient report)  Her mother was tested due to history of recurrent preganncy loss.  --given her lack of personal history of VTE/"clot", we addressed the overall low risk for VTE (0.4-2.6% risk) during pregnancy and the postpartum period.   - In the setting of the Prothrombin gene mutation alone, we would advise prophylactic lovenox during the the postpartum period only, however given her reported history of Factor 8 elevation (mild increased risk for VTE) and obesity it is reasonable to continue the lovenox during pregnancy.  --if labs are not available, consider repeating testing to confirm.   --Recommend continue prophylactic lovenox 39m daily --hold lovenox 12h prior to expected delivery to enable regional anesthesia --resume prophylactic lovenox 12 h after vaginal delivery or 24 h after  cesarean delivery --she is aware of need to avoid estrogen containing contraception  3. Obesity We addressed increased risk for conditions such as GDM, preeclampsia, and macrosomia.  She reports negative early glucola ------recommend baseline EKG, CMP, CBC, P:C ratio --we addressed IOM guidelines of 10-15lb weight gain during preganncy --she may benefit from nutritionist referral/lifestyles center  4. Genetic screening--she was noted to have a low fetal fraction in her initial screen. She met with our genetic counselor and addressed concerns (incudlind possible increased risk for aneuploidy) and will have cffDNA screening repeated today.  She had an unremarkable first trimester ultrasound.  She will also have Fragile X, SMa and CF testing today.    Total time spent with the patient was 30 minutes with greater than 50% spent in counseling and coordination of care. We appreciate this interesting consult and will be happy to be involved in the ongoing care of Ms. ZFullenwiderin anyway her obstetricians desire.  MManfred Shirts  MD [facility]

## 2018-09-18 LAB — MATERNIT21 PLUS CORE+SCA
Fetal Fraction: 3
Monosomy X (Turner Syndrome): NOT DETECTED
Result (T21): NEGATIVE
Trisomy 13 (Patau syndrome): NEGATIVE
Trisomy 18 (Edwards syndrome): NEGATIVE
Trisomy 21 (Down syndrome): NEGATIVE
XXX (Triple X Syndrome): NOT DETECTED
XXY (Klinefelter Syndrome): NOT DETECTED
XYY (Jacobs Syndrome): NOT DETECTED

## 2018-09-20 ENCOUNTER — Telehealth: Payer: Self-pay | Admitting: Obstetrics and Gynecology

## 2018-09-20 ENCOUNTER — Other Ambulatory Visit: Payer: Self-pay

## 2018-09-20 DIAGNOSIS — O26892 Other specified pregnancy related conditions, second trimester: Secondary | ICD-10-CM | POA: Diagnosis not present

## 2018-09-20 DIAGNOSIS — I10 Essential (primary) hypertension: Secondary | ICD-10-CM | POA: Diagnosis not present

## 2018-09-20 DIAGNOSIS — N898 Other specified noninflammatory disorders of vagina: Secondary | ICD-10-CM | POA: Diagnosis not present

## 2018-09-20 DIAGNOSIS — Z369 Encounter for antenatal screening, unspecified: Secondary | ICD-10-CM

## 2018-09-20 DIAGNOSIS — R300 Dysuria: Secondary | ICD-10-CM | POA: Diagnosis not present

## 2018-09-20 NOTE — Telephone Encounter (Signed)
Ms. Bellanger initially had MaterniT21 results which were no reportable due to low fetal fraction.  She elected to have this testing repeated following genetic counseling at [facility] on 09/13/2018.  The second specimen was able to yield results, and the patient was informed of the NEGATIVE results.  The patient's specimen showed DNA consistent with two copies of chromosomes 21, 18 and 13.  The sensitivity for trisomy 12, trisomy 50 and trisomy 43 using this testing are reported as 99.1%, 99.9% and 91.7% respectively.  Thus, while the results of this testing are highly accurate, they are not considered diagnostic at this time.  Should more definitive information be desired, the patient may still consider amniocentesis.   As requested to know by the patient, sex chromosome analysis was included for this sample.  Results are consistent with a female (no Y chromosome material present) fetus. This is predicted with >99% accuracy.  A maternal serum AFP only should be considered if screening for neural tube defects is desired.  In addition, Ms. Mitchum desired carrier screening for CF, SMA, Fragile X and hemoglobinopathies. Apparently this was not drawn and/or was not sent to the lab for testing.  We offered a repeat of this testing, which the patient desires.  Orders were placed through the [place] today so that the patient may present to any [facility] drawn location for testing at her convenience.  She was given those local locations. I will contact her with those results, usually in 2 weeks.  We may be reached at [phone] with any questions or concerns.   Wilburt Finlay, MS, CGC

## 2018-10-08 ENCOUNTER — Telehealth: Payer: Self-pay | Admitting: Obstetrics and Gynecology

## 2018-10-08 NOTE — Telephone Encounter (Signed)
I spoke with Ms. Carnathan about the labs that were apparently not drawn at her visit on 09/13/18.  We had previously agreed that she would go to a [facility] draw site and I ordered the carrier screening there, but she has not yet gone.  The patient stated that she has an appointment with Dr. Leafy Ro at [facility] on Thursday 9/17, so I suggested that perhaps she could have them drawn at that time.  She desires this, so I emailed Dr. Leafy Ro and requested that testing be drawn for the following:  [place] carrier screening CF carrier screening SMA carrier screening Hemoglobinopathy profile  If this does not happened, then I would offer the option for drawing them at her visit to [facility] on 10/18/18. We may be reached at [phone] if needed.  Wilburt Finlay, MS, CGC

## 2018-10-15 ENCOUNTER — Other Ambulatory Visit: Payer: Self-pay

## 2018-10-15 DIAGNOSIS — Z3A18 18 weeks gestation of pregnancy: Secondary | ICD-10-CM

## 2018-10-18 ENCOUNTER — Other Ambulatory Visit: Payer: Self-pay

## 2018-10-18 ENCOUNTER — Ambulatory Visit
Admission: RE | Admit: 2018-10-18 | Discharge: 2018-10-18 | Disposition: A | Discharge status: Home / Self Care | Payer: BC Managed Care – PPO | Source: Ambulatory Visit | Attending: Obstetrics and Gynecology | Admitting: Obstetrics and Gynecology

## 2018-10-18 DIAGNOSIS — E669 Obesity, unspecified: Secondary | ICD-10-CM | POA: Diagnosis not present

## 2018-10-18 DIAGNOSIS — Z3A18 18 weeks gestation of pregnancy: Secondary | ICD-10-CM | POA: Diagnosis not present

## 2018-10-18 DIAGNOSIS — O162 Unspecified maternal hypertension, second trimester: Secondary | ICD-10-CM | POA: Insufficient documentation

## 2018-10-18 DIAGNOSIS — Z7901 Long term (current) use of anticoagulants: Secondary | ICD-10-CM | POA: Insufficient documentation

## 2018-10-18 DIAGNOSIS — O99212 Obesity complicating pregnancy, second trimester: Secondary | ICD-10-CM | POA: Diagnosis not present

## 2018-10-18 HISTORY — DX: Essential (primary) hypertension: I10

## 2018-10-22 LAB — HEMOGLOBINOPATHY EVALUATION
Hgb A2 Quant: 2.4 % (ref 1.8–3.2)
Hgb A: 97.6 % (ref 96.4–98.8)
Hgb C: 0 %
Hgb F Quant: 0 % (ref 0.0–2.0)
Hgb S Quant: 0 %
Hgb Variant: 0 %

## 2018-10-29 LAB — MISC LABCORP TEST (SEND OUT): Labcorp test code: 451964

## 2018-11-01 ENCOUNTER — Telehealth: Payer: Self-pay | Admitting: Obstetrics and Gynecology

## 2018-11-01 DIAGNOSIS — O0992 Supervision of high risk pregnancy, unspecified, second trimester: Secondary | ICD-10-CM | POA: Diagnosis not present

## 2018-11-01 NOTE — Telephone Encounter (Signed)
  We reached ot to Ms. Grether to inform her that the results of the recent screening test for Cystic fibrosis (CF), Spinal Muscular Atrophy (SMA), hemoglobinopathies and Fragile X syndrome are now available and are all within normal limits.    To review, CF is a genetic condition that occurs most often in Caucasian persons.  It primarily affects the lungs, digestive, and reproductive systems.  For someone to be at risk for having CF, both of their parents must be carriers for CF.  The testing can detect many persons who are carriers for CF and therefore determine if the pregnancy is at an increased risk for this condition.    The blood test results were negative when examined for the 32 most common mutations (or changes) in the gene for CF.  This means that she does not carry any of the most common changes in this gene.  Testing for these 32 mutations detects approximately 90% of carriers who are Caucasian.  Therefore, the chance that she is a carrier based on this negative result has been reduced from 1 in 25 to approximately 1 in 240.  Because this testing cannot detect all changes that may cause CF, we cannot eliminate the chance that this individual is a carrier completely.  The results of the SMA carrier screening are also available.  SMA is also a recessive genetic condition with variable age of onset and severity caused by mutations in the SMN1 gene.  This carrier testing assesses the number of copies of this gene.  Persons with one copy of the SMN1 gene are carriers, and those with no copies are affected with the condition.  Individuals with two or more copies have a reduced chance to be a carrier.  Not all mutations can be detected with this testing, though it can detect 94.8% of carriers in the Caucasian population.  The results revealed that Ms. Batchelder has an SMN1 copy number of 2 and is negative for the c. *3+80T>G SNP, thus reducing her chance to be a carrier from 1 in 3 to 1 in 921.  Again, this  testing cannot eliminate the chance to have a child with SMA, but dramatically reduces the chance.    Hemoglobinopathy screening, which assesses for inherited forms of anemia including sickle cell trait showed normal adult hemoglobin (AA).    Lastly, Fragile X carrier screening was offered due to the family history of autism.  Fragile X syndrome is the most common inherited cause of intellectual disabilities and often has autism as a feature.  It is caused by a change, or expansion, of the DNA in the gene FMR1.  Carrier screening is used to determine the number of repeats in this region of the gene and to determine the likelihood of having a child with this condition. Ms. Burda results showed copy numbers of 20 and 23, which are well within the normal range.  This negative result means that Ms. Stangl is not at risk for having a child with Fragile X, but cannot exclude other causes for autism or intellectual delays in the family.    If there are any questions or concerns, please feel free to contact our office at [phone].    Wilburt Finlay, MS, CGC

## 2018-11-29 DIAGNOSIS — Z23 Encounter for immunization: Secondary | ICD-10-CM | POA: Diagnosis not present

## 2018-12-04 ENCOUNTER — Other Ambulatory Visit: Payer: Self-pay | Admitting: Obstetrics and Gynecology

## 2018-12-04 DIAGNOSIS — O99213 Obesity complicating pregnancy, third trimester: Secondary | ICD-10-CM

## 2018-12-17 ENCOUNTER — Inpatient Hospital Stay: Admission: RE | Admit: 2018-12-17 | Payer: BC Managed Care – PPO | Source: Ambulatory Visit

## 2018-12-24 ENCOUNTER — Emergency Department
Admission: EM | Admit: 2018-12-24 | Discharge: 2018-12-24 | Disposition: A | Discharge status: Home / Self Care | Payer: BC Managed Care – PPO | Attending: Emergency Medicine | Admitting: Emergency Medicine

## 2018-12-24 ENCOUNTER — Other Ambulatory Visit: Payer: Self-pay

## 2018-12-24 ENCOUNTER — Emergency Department: Payer: BC Managed Care – PPO

## 2018-12-24 ENCOUNTER — Encounter: Payer: Self-pay | Admitting: Emergency Medicine

## 2018-12-24 DIAGNOSIS — Z3A28 28 weeks gestation of pregnancy: Secondary | ICD-10-CM | POA: Diagnosis not present

## 2018-12-24 DIAGNOSIS — Z79899 Other long term (current) drug therapy: Secondary | ICD-10-CM | POA: Diagnosis not present

## 2018-12-24 DIAGNOSIS — R002 Palpitations: Secondary | ICD-10-CM | POA: Diagnosis not present

## 2018-12-24 DIAGNOSIS — I10 Essential (primary) hypertension: Secondary | ICD-10-CM | POA: Diagnosis not present

## 2018-12-24 DIAGNOSIS — Z87891 Personal history of nicotine dependence: Secondary | ICD-10-CM | POA: Insufficient documentation

## 2018-12-24 DIAGNOSIS — O99891 Other specified diseases and conditions complicating pregnancy: Secondary | ICD-10-CM | POA: Diagnosis not present

## 2018-12-24 DIAGNOSIS — Z7982 Long term (current) use of aspirin: Secondary | ICD-10-CM | POA: Diagnosis not present

## 2018-12-24 DIAGNOSIS — Z3A27 27 weeks gestation of pregnancy: Secondary | ICD-10-CM

## 2018-12-24 DIAGNOSIS — O99893 Other specified diseases and conditions complicating puerperium: Secondary | ICD-10-CM | POA: Diagnosis not present

## 2018-12-24 DIAGNOSIS — R079 Chest pain, unspecified: Secondary | ICD-10-CM | POA: Diagnosis not present

## 2018-12-24 DIAGNOSIS — Z3493 Encounter for supervision of normal pregnancy, unspecified, third trimester: Secondary | ICD-10-CM

## 2018-12-24 DIAGNOSIS — R600 Localized edema: Secondary | ICD-10-CM | POA: Diagnosis not present

## 2018-12-24 LAB — BASIC METABOLIC PANEL
Anion gap: 13 (ref 5–15)
BUN: 7 mg/dL (ref 6–20)
CO2: 17 mmol/L — ABNORMAL LOW (ref 22–32)
Calcium: 9.7 mg/dL (ref 8.9–10.3)
Chloride: 107 mmol/L (ref 98–111)
Creatinine, Ser: 0.65 mg/dL (ref 0.44–1.00)
GFR calc Af Amer: >60 mL/min (ref >60)
GFR calc non Af Amer: >60 mL/min (ref >60)
Glucose, Bld: 101 mg/dL — ABNORMAL HIGH (ref 70–99)
Potassium: 3.6 mmol/L (ref 3.5–5.1)
Sodium: 137 mmol/L (ref 135–145)

## 2018-12-24 LAB — CBC
HCT: 33.6 % — ABNORMAL LOW (ref 36.0–46.0)
Hemoglobin: 11.5 g/dL — ABNORMAL LOW (ref 12.0–15.0)
MCH: 26.5 pg (ref 26.0–34.0)
MCHC: 34.2 g/dL (ref 30.0–36.0)
MCV: 77.4 fL — ABNORMAL LOW (ref 80.0–100.0)
Platelets: 300 10*3/uL (ref 150–400)
RBC: 4.34 MIL/uL (ref 3.87–5.11)
RDW: 13.2 % (ref 11.5–15.5)
WBC: 10.5 10*3/uL (ref 4.0–10.5)
nRBC: 0 % (ref 0.0–0.2)

## 2018-12-24 LAB — TROPONIN I (HIGH SENSITIVITY)
Troponin I (High Sensitivity): <2 ng/L (ref <18)
Troponin I (High Sensitivity): 2 ng/L (ref <18)

## 2018-12-24 LAB — FIBRIN DERIVATIVES D-DIMER (ARMC ONLY): Fibrin derivatives D-dimer (ARMC): 731.58 ng/mL (FEU) — ABNORMAL HIGH (ref 0.00–499.00)

## 2018-12-24 MED ORDER — IOHEXOL 350 MG/ML SOLN
75.0000 mL | Freq: Once | INTRAVENOUS | Status: AC | PRN
  Administered 2018-12-24: 75 mL via INTRAVENOUS

## 2018-12-24 MED ORDER — DIPHENHYDRAMINE HCL 25 MG PO CAPS: 25.0000 mg | ORAL_CAPSULE | Freq: Four times a day (QID) | ORAL | 0 refills | Status: DC | PRN

## 2018-12-24 NOTE — ED Notes (Signed)
Pt back from X-ray.  

## 2018-12-24 NOTE — ED Notes (Signed)
Md at bedside

## 2018-12-24 NOTE — ED Notes (Signed)
Family at bedside. 

## 2018-12-24 NOTE — ED Notes (Signed)
Paper discharge instructions signed and placed in chart

## 2018-12-24 NOTE — ED Notes (Signed)
Patient to stat desk in no acute distress asking about wait time. Patient given update on wait time. Patient verbalizes understanding.  

## 2018-12-24 NOTE — ED Provider Notes (Signed)
[facility] Emergency Department Provider Note  ____________________________________________  Time seen: Approximately 9:11 AM  I have reviewed the triage vital signs and the nursing notes.   HISTORY  Chief Complaint Palpitations    HPI [name] is a 23 y.o. female with a past history of hypertension, prothrombotic disorder,  currently [redacted] weeks pregnant, who comes the ED complaining of palpitations and intermittent left chest pain for the past 2 weeks.  No aggravating or alleviating factors.  No shortness of breath diaphoresis or vomiting.  Not exertional, not pleuritic.  She also reports left arm and shoulder pain for the past 2 days without aggravating or alleviating factors.  No injuries or heavy lifting.  Nonradiating  Also reports the development of bilateral calf soreness and ankle swelling over the past week.   Past Medical History:  Diagnosis Date  . Chlamydia 6/15, 11/15, 2/16  . Clotting disorder (HCC) 2012   PROTHROMBIN GENE MUTATION AND fACTOR viii - NO ESTROGEN PRODUCTS  . Esophageal reflux   . Family history of breast cancer 05/08/2013   WILL DISCUSS GENETIC TESTING WHEN PT TURNS 21  . Hypertension   . Immunization, viral disease    GARDASIL SERIES COMPLETED  . Irregular menses 03/19/2012  . Menorrhagia 2012   OFF BC  . Mononucleosis 01/08/2014   [facility]  . Pre-diabetes 2014   [facility] IN THE PAST  . Proctalgia    Intermittently     Patient Active Problem List   Diagnosis Date Noted  . Encounter for antenatal screening for chromosomal anomalies   . Family history of autism   . Hyperemesis gravidarum 09/06/2018  . UTI (urinary tract infection) 03/20/2017  . Anxiety and depression 06/16/2016  . Hypertension 02/01/2012  . Insulin resistance 02/01/2012  . Obesity 02/01/2012  . Pancreatitis, acute 02/01/2012  . 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 Esophageal reflux   . Proctalgia      Past Surgical  History:  Procedure Laterality Date  . WISDOM TOOTH EXTRACTION       Prior to Admission medications   Medication Sig Start Date End Date Taking? Authorizing Provider  aspirin 81 MG chewable tablet Chew 81 mg by mouth daily.    [provider]  diphenhydrAMINE (BENADRYL) 25 mg capsule Take 1 capsule (25 mg total) by mouth every 6 (six) hours as needed (acid reflux / chest pain). 12/24/18   Sharman Cheek, MD  enoxaparin (LOVENOX) 40 MG/0.4ML injection Inject 0.4 mLs (40 mg total) into the skin at bedtime. 09/07/18   [name], CNM     Allergies Estrogens   Family History  Problem Relation Age of Onset  . Clotting disorder Mother   . Breast cancer Paternal Aunt   . Hypertension Maternal Grandmother   . Heart disease Maternal Grandfather   . Heart attack Maternal Grandfather   . Stroke Maternal Grandfather   . Kidney disease Sister     Social History Social History   Tobacco Use  . Smoking status: Former Smoker    Types: Cigarettes    Quit date: 05/25/2015    Years since quitting: 3.5  . Smokeless tobacco: Never Used  Substance Use Topics  . Alcohol use: Not Currently  . Drug use: No    Review of Systems  Constitutional:   No fever or chills.  ENT:   No sore throat. No rhinorrhea. Cardiovascular:   Positive chest pain and palpitations as above without syncope. Respiratory:   No dyspnea or cough. Gastrointestinal:  Negative for abdominal pain, vomiting and diarrhea.  Musculoskeletal:   Positive left shoulder pain as above All other systems reviewed and are negative except as documented above in ROS and HPI.  ____________________________________________   PHYSICAL EXAM:  VITAL SIGNS: ED Triage Vitals  Enc Vitals Group     BP 12/24/18 0401 140/75     Pulse Rate 12/24/18 0401 (!) 115     Resp 12/24/18 0401 18     Temp 12/24/18 0401 98.9 F (37.2 C)     Temp Source 12/24/18 0401 Oral     SpO2 12/24/18 0401 99 %     Weight 12/24/18 0357  250 lb (113.4 kg)     Height 12/24/18 0357 5\' 9"  (1.753 m)     Head Circumference --      Peak Flow --      Pain Score 12/24/18 0357 4     Pain Loc --      Pain Edu? --      Excl. in GC? --     Vital signs reviewed, nursing assessments reviewed.   Constitutional:   Alert and oriented. Non-toxic appearance. Eyes:   Conjunctivae are normal. EOMI. PERRL. ENT      Head:   Normocephalic and atraumatic.      Nose:   Wearing a mask.      Mouth/Throat:   Wearing a mask.      Neck:   No meningismus. Full ROM. Hematological/Lymphatic/Immunilogical:   No cervical lymphadenopathy. Cardiovascular:   Tachycardia heart rate 120. Symmetric bilateral radial and DP pulses.  No murmurs. Cap refill less than 2 seconds. Respiratory:   Normal respiratory effort without tachypnea/retractions. Breath sounds are clear and equal bilaterally. No wheezes/rales/rhonchi. Gastrointestinal:   Soft and nontender.  Gravid size consistent with dates. There is no CVA tenderness.  No rebound, rigidity, or guarding.  Musculoskeletal:   Normal range of motion in all extremities. No joint effusions.  No lower extremity tenderness.  No edema.  There is tenderness reproducing pain in the left infraclavicular and supraclavicular space, trapezius is very tense. Neurologic:   Normal speech and language.  Motor grossly intact. No acute focal neurologic deficits are appreciated.  Skin:    Skin is warm, dry and intact. No rash noted.  No petechiae, purpura, or bullae.  ____________________________________________    LABS (pertinent positives/negatives) (all labs ordered are listed, but only abnormal results are displayed) Labs Reviewed  BASIC METABOLIC PANEL - Abnormal; Notable for the following components:      Result Value   CO2 17 (*)    Glucose, Bld 101 (*)    All other components within normal limits  CBC - Abnormal; Notable for the following components:   Hemoglobin 11.5 (*)    HCT 33.6 (*)    MCV 77.4 (*)     All other components within normal limits  FIBRIN DERIVATIVES D-DIMER ([facility] ONLY) - Abnormal; Notable for the following components:   Fibrin derivatives D-dimer ([facility]) 731.58 (*)    All other components within normal limits  POC URINE PREG, ED  TROPONIN I (HIGH SENSITIVITY)  TROPONIN I (HIGH SENSITIVITY)   ____________________________________________   EKG  Interpreted by me Sinus tachycardia rate 117.  Normal axis and intervals.  Normal QRS ST segments and T waves.  No evidence of right heart strain  ____________________________________________    RADIOLOGY  Dg Chest 2 View  Result Date: 12/24/2018 CLINICAL DATA:  Chest pain EXAM: CHEST - 2 VIEW COMPARISON:  None. FINDINGS: Lungs are  clear. Heart size and pulmonary vascularity are normal. No adenopathy. No pneumothorax. No bone lesions. IMPRESSION: No abnormality noted. Electronically Signed   By: Bretta Bang III M.D.   On: 12/24/2018 09:08   Ct Angio Chest Pe W And/or Wo Contrast  Result Date: 12/24/2018 CLINICAL DATA:  Left arm and shoulder pain with palpitations at [redacted] weeks gestation. EXAM: CT ANGIOGRAPHY CHEST WITH CONTRAST TECHNIQUE: Multidetector CT imaging of the chest was performed using the standard protocol during bolus administration of intravenous contrast. Multiplanar CT image reconstructions and MIPs were obtained to evaluate the vascular anatomy. CONTRAST:  75mL OMNIPAQUE IOHEXOL 350 MG/ML SOLN COMPARISON:  None. FINDINGS: Cardiovascular: The heart size is normal. No substantial pericardial effusion. No thoracic aortic aneurysm. Suboptimal bolus timing. Within this limitation of bolus timing, there is no large central filling defect within the pulmonary arteries to suggest an acute pulmonary embolus. No evidence for filling defect in segmental or subsegmental pulmonary arteries of either lung although assessment may not be reliable due to bolus timing. Mediastinum/Nodes: No mediastinal lymphadenopathy. There is no  hilar lymphadenopathy. The esophagus has normal imaging features. There is no axillary lymphadenopathy. Lungs/Pleura: No focal airspace consolidation. No pulmonary edema or pleural effusion. Upper Abdomen: Unremarkable. Musculoskeletal: No worrisome lytic or sclerotic osseous abnormality. Review of the MIP images confirms the above findings. IMPRESSION: 1. No large central pulmonary embolus. Segmental and subsegmental pulmonary arteries show no filling defects to suggest pulmonary embolism although assessment may be unreliable due to bolus timing. 2. Otherwise unremarkable exam. Electronically Signed   By: Kennith Center M.D.   On: 12/24/2018 12:23   US Venous Img Lower Bilateral  Result Date: 12/24/2018 CLINICAL DATA:  36-week-old pregnant female with bilateral lower extremity edema. EXAM: BILATERAL LOWER EXTREMITY VENOUS DOPPLER ULTRASOUND TECHNIQUE: Gray-scale sonography with graded compression, as well as color Doppler and duplex ultrasound were performed to evaluate the lower extremity deep venous systems from the level of the common femoral vein and including the common femoral, femoral, profunda femoral, popliteal and calf veins including the posterior tibial, peroneal and gastrocnemius veins when visible. The superficial great saphenous vein was also interrogated. Spectral Doppler was utilized to evaluate flow at rest and with distal augmentation maneuvers in the common femoral, femoral and popliteal veins. COMPARISON:  None. FINDINGS: RIGHT LOWER EXTREMITY Common Femoral Vein: No evidence of thrombus. Normal compressibility, respiratory phasicity and response to augmentation. Saphenofemoral Junction: No evidence of thrombus. Normal compressibility and flow on color Doppler imaging. Profunda Femoral Vein: No evidence of thrombus. Normal compressibility and flow on color Doppler imaging. Femoral Vein: No evidence of thrombus. Normal compressibility, respiratory phasicity and response to augmentation.  Popliteal Vein: No evidence of thrombus. Normal compressibility, respiratory phasicity and response to augmentation. Calf Veins: No evidence of thrombus. Normal compressibility and flow on color Doppler imaging. Superficial Great Saphenous Vein: No evidence of thrombus. Normal compressibility. Venous Reflux:  None. Other Findings:  None. LEFT LOWER EXTREMITY Common Femoral Vein: No evidence of thrombus. Normal compressibility, respiratory phasicity and response to augmentation. Saphenofemoral Junction: No evidence of thrombus. Normal compressibility and flow on color Doppler imaging. Profunda Femoral Vein: No evidence of thrombus. Normal compressibility and flow on color Doppler imaging. Femoral Vein: No evidence of thrombus. Normal compressibility, respiratory phasicity and response to augmentation. Popliteal Vein: No evidence of thrombus. Normal compressibility, respiratory phasicity and response to augmentation. Calf Veins: No evidence of thrombus. Normal compressibility and flow on color Doppler imaging. Superficial Great Saphenous Vein: No evidence of thrombus. Normal compressibility. Venous  Reflux:  None. Other Findings:  None. IMPRESSION: No evidence of deep venous thrombosis in either lower extremity. Electronically Signed   By: Jacqulynn Cadet M.D.   On: 12/24/2018 09:42    ____________________________________________   PROCEDURES Procedures  ____________________________________________  DIFFERENTIAL DIAGNOSIS   Muscle spasm, benign palpitations, electrolyte abnormality, pulmonary embolism  CLINICAL IMPRESSION / ASSESSMENT AND PLAN / ED COURSE  Medications ordered in the ED: Medications  iohexol (OMNIPAQUE) 350 MG/ML injection 75 mL (75 mLs Intravenous Contrast Given 12/24/18 1206)    Pertinent labs & imaging results that were available during my care of the patient were reviewed by me and considered in my medical decision making (see chart for details).  [name] was  evaluated in Emergency Department on 12/24/2018 for the symptoms described in the history of present illness. She was evaluated in the context of the global COVID-19 pandemic, which necessitated consideration that the patient might be at risk for infection with the SARS-CoV-2 virus that causes COVID-19. Institutional protocols and algorithms that pertain to the evaluation of patients at risk for COVID-19 are in a state of rapid change based on information released by regulatory bodies including the CDC and federal and state organizations. These policies and algorithms were followed during the patient's care in the ED.   Patient presents with palpitations, chest pain, and musculoskeletal shoulder pain.  The tachycardia may be physiologic due to current state of pregnancy, but given tachycardia, chest pain, palpitations, history of prothrombotic disorder, will need to screen for PE with bilateral venous ultrasound of the legs and D-dimer.  ----------------------------------------- 11:27 AM on 12/24/2018 -----------------------------------------  Venous ultrasound is negative.  Chest x-ray unremarkable.  D-dimer is elevated at greater than 700.  Discussed with the patient proceeding with CT angiogram of the chest to evaluate for PE.  She request that I discussed with her obstetrics team first to make sure that they are in agreement.  I have paged prenatal clinic on call.  ----------------------------------------- 12:00 PM on 12/24/2018 ----------------------------------------- Patient now agreeable to proceed with CT scan without talking with obstetrics first.   Clinical Course as of Dec 24 1246  Mon Dec 24, 2018  1242 CTA chest negative for identifiable PE.  Patient is on aspirin and Lovenox, and she is stable to continue this and follow-up with her OB this week.   [PS]    Clinical Course User Index [PS] Carrie Mew, MD     ____________________________________________   FINAL  CLINICAL IMPRESSION(S) / ED DIAGNOSES    Final diagnoses:  Palpitations  Chest pain, unspecified type  Third trimester pregnancy     ED Discharge Orders         Ordered    diphenhydrAMINE (BENADRYL) 25 mg capsule  Every 6 hours PRN     12/24/18 1246          Portions of this note were generated with dragon dictation software. Dictation errors may occur despite best attempts at proofreading.   Carrie Mew, MD 12/24/18 1248

## 2018-12-24 NOTE — ED Notes (Signed)
Pt taken to xray 

## 2018-12-24 NOTE — Discharge Instructions (Signed)
Your CT scan of the chest did not find any blood clots in your lungs.  It is unclear why you are having palpitations.  Try Benadryl the next few days to see if this helps by decreasing your stomach acid.  Follow-up with your obstetrics clinic for further monitoring of your symptoms.

## 2018-12-24 NOTE — ED Notes (Signed)
EDP at bedside  

## 2018-12-24 NOTE — ED Triage Notes (Signed)
Patient states that she is [redacted] weeks pregnant. Patient states that she has been having palpitations for 2 weeks. Patient states that the palpitations are becoming more frequent. Patient states that she has had left arm pain times two days.

## 2018-12-24 NOTE — ED Notes (Signed)
Pt to CT

## 2018-12-27 ENCOUNTER — Other Ambulatory Visit: Payer: Self-pay

## 2018-12-27 ENCOUNTER — Ambulatory Visit
Admission: RE | Admit: 2018-12-27 | Discharge: 2018-12-27 | Disposition: A | Discharge status: Home / Self Care | Payer: BC Managed Care – PPO | Source: Ambulatory Visit | Attending: Obstetrics and Gynecology | Admitting: Obstetrics and Gynecology

## 2018-12-27 DIAGNOSIS — Z3A28 28 weeks gestation of pregnancy: Secondary | ICD-10-CM | POA: Diagnosis not present

## 2018-12-27 DIAGNOSIS — O10919 Unspecified pre-existing hypertension complicating pregnancy, unspecified trimester: Secondary | ICD-10-CM | POA: Diagnosis not present

## 2018-12-27 DIAGNOSIS — O0992 Supervision of high risk pregnancy, unspecified, second trimester: Secondary | ICD-10-CM | POA: Diagnosis not present

## 2018-12-27 DIAGNOSIS — Z23 Encounter for immunization: Secondary | ICD-10-CM | POA: Diagnosis not present

## 2018-12-27 DIAGNOSIS — O0993 Supervision of high risk pregnancy, unspecified, third trimester: Secondary | ICD-10-CM | POA: Diagnosis not present

## 2018-12-27 DIAGNOSIS — O99213 Obesity complicating pregnancy, third trimester: Secondary | ICD-10-CM | POA: Insufficient documentation

## 2018-12-27 DIAGNOSIS — E669 Obesity, unspecified: Secondary | ICD-10-CM | POA: Insufficient documentation

## 2019-01-10 DIAGNOSIS — O10913 Unspecified pre-existing hypertension complicating pregnancy, third trimester: Secondary | ICD-10-CM | POA: Diagnosis not present

## 2019-01-10 DIAGNOSIS — O0993 Supervision of high risk pregnancy, unspecified, third trimester: Secondary | ICD-10-CM | POA: Diagnosis not present

## 2019-01-25 NOTE — L&D Delivery Note (Signed)
Delivery Note At 11:02 AM a viable female was delivered via Vaginal, Spontaneous (Presentation:      ).  APGAR: 8, 9; weight  .   Placenta status: Spontaneous, Intact.  Cord: 3 vessels with the following complications: None.  Cord pH not done  Anesthesia: Epidural;Local;Other Episiotomy:  none Lacerations: 2nd degree Suture Repair: 2.0 3.0 vicryl Est. Blood Loss (mL): 1255 measured   Mom to postpartum.  Baby to Couplet care / Skin to Skin.  Ihor Austin Schermerhorn 02/27/2019, 11:50 AM

## 2019-01-31 ENCOUNTER — Other Ambulatory Visit: Payer: Self-pay | Admitting: Certified Nurse Midwife

## 2019-01-31 NOTE — Progress Notes (Signed)
24 y.o. G1P0000 at [redacted]w[redacted]d. Patient's last menstrual period was 06/12/2018 (exact date). consistent  with ultrasound @ [redacted]w[redacted]d. Estimated Date of Delivery: 03/19/19  Sex of baby and name:  Girl "Penelope"   FOB:      Factors complicating this pregnancy  1. Coffee ground emesis  pt called on 8/4 with blood in emesis x 2 and coffee grounds appearance.  Taking B6/Unisom, she does not like Zofran   Eval in ER 8/4, GI referral per d/w Dr Feliberto Gottron. Rx Phenergan suppositories, clear liquids and advance diet as tolerated.  8/27: resolved N/V, has stopped all GI meds and feeling well.   2. 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  Formal referral:  Per MFM: consider repeat testing, continue prophy lovenox, hold for 12hrs prior to expected delivery, resume 12h s/p SVD or 24hrs s/p CS 3. Chronic hypertension:  BP 148/100 in office at [facility]. 120-130/90s at amenorrhea visit.  y.  Baseline CMP: wnl, P/C ratio: 112  started on Labetalol 200mg  BID 8/19 by Dr 9/19.   Per MFM, weekly antenatal testing at 34wk [12/27/18 ], delivery by 39wks;   8/27- baseline EKG done [ x ]. wnl 4. Prepregnancy BMI 37.8  early GTT: 120 5. Anxiety/Depression  on Celexa 20mg  daily, encouraged counseling.   Needs repeat EPDS next visit   Stopped meds in early August  EPPS 20 on 12/27/2018  Restarted Celexa 20mg  daily  EPDS 17 at 30w 6. Plan induction of labor   Feb 26, 2019 at [redacted]w[redacted]d  Screening results and needs: ? NOB:   Medicaid Questionnaire: n/a  Depression Score: 19  MBT: A positive  Ab screen: Neg  Pap:  HIV: Neg Hep B/RPR: Neg/NR  G/C: Neg 7/13  Rubella: Immune   VZV: Immune, UA culture: no growth  P/C ratio : 114, CMP: HgbA1C:5.1 Early glucola 120 ? Aneuploidy:   First trimester:   MaternitT21: 09/13/2018 negative, XX, 11/01/18:  CF, SMA and fragile X = WNL  Second trimester (AFP): ordered  11/01/2018 mah ? 28 weeks:   Review Medicaid Questionnaire:n/a  Depression Score: 20  Blood consent:Signed 12/27/18 DLC  Hgb: 11  Platelets: 315   Glucola: 97  Rhogam: n/a ? 36 weeks:   GBS:   G/C:   Hgb:  Platelets:    HIV/RPR:    Last 01/01/19:  ? 08/06/18: Pelvic 14/3/20: Single iup seen with yolk sac and fetal pole cwd, Crl=1.45 cm=7.6 wks, Fhr=173 bpm, Lt ovwnl, Rt simple ov cyst=2.47 cm, cx length=4.67 cm ? 09/13/18: [facility]: Fetal evaluation Gest sac = normal, Yolk sac not visualized, fetal pole visualized, FHR= 157, cardiac activity  present, presentation  variable, placenta posterior, cervix  wnl,uterus  wnl, adnexa  wnl. ? 10/18/2018 [facility]: Detailed US performed due to obesity and anticoagulation therapy. Due to poor maternal acoustics, 3 vessel trachea and bicaval views of the heart were suboptimal. Other fetal anatomy views appear normal within the limitations of ultrasound. Follow up growth 09/15/18 scheduled at about 28 weeks.  ? 01/10/19: Efw=3lb9oz (1613g)=51%, Afi=13.89cm @ 45%, Fhr=155bpm, Placenta=posterior lt lateral, Position=vertex,Stomach not visualized  Immunization:    Flu in season - given 11/5 RLA  Tdap at 27-36 weeks - Given 12/27/2018 [facility]  Contraception Plan: condoms/withdrawal  Feeding Plan: pumping and formula

## 2019-02-06 LAB — OB RESULTS CONSOLE GC/CHLAMYDIA
Chlamydia: NEGATIVE
Gonorrhea: NEGATIVE

## 2019-02-12 DIAGNOSIS — O10919 Unspecified pre-existing hypertension complicating pregnancy, unspecified trimester: Secondary | ICD-10-CM | POA: Diagnosis not present

## 2019-02-12 DIAGNOSIS — O10913 Unspecified pre-existing hypertension complicating pregnancy, third trimester: Secondary | ICD-10-CM | POA: Diagnosis not present

## 2019-02-20 DIAGNOSIS — O0993 Supervision of high risk pregnancy, unspecified, third trimester: Secondary | ICD-10-CM | POA: Diagnosis not present

## 2019-02-20 DIAGNOSIS — O10919 Unspecified pre-existing hypertension complicating pregnancy, unspecified trimester: Secondary | ICD-10-CM | POA: Diagnosis not present

## 2019-02-20 LAB — OB RESULTS CONSOLE HEPATITIS B SURFACE ANTIGEN: Hepatitis B Surface Ag: NEGATIVE

## 2019-02-20 LAB — OB RESULTS CONSOLE HIV ANTIBODY (ROUTINE TESTING): HIV: NONREACTIVE

## 2019-02-20 LAB — OB RESULTS CONSOLE VARICELLA ZOSTER ANTIBODY, IGG: Varicella: IMMUNE

## 2019-02-20 LAB — OB RESULTS CONSOLE GBS: GBS: NEGATIVE

## 2019-02-20 LAB — OB RESULTS CONSOLE RUBELLA ANTIBODY, IGM: Rubella: IMMUNE

## 2019-02-22 ENCOUNTER — Other Ambulatory Visit
Admission: RE | Admit: 2019-02-22 | Discharge: 2019-02-22 | Disposition: A | Discharge status: Home / Self Care | Payer: BC Managed Care – PPO | Source: Ambulatory Visit | Attending: Obstetrics and Gynecology | Admitting: Obstetrics and Gynecology

## 2019-02-22 ENCOUNTER — Other Ambulatory Visit: Payer: Self-pay

## 2019-02-22 DIAGNOSIS — Z01812 Encounter for preprocedural laboratory examination: Secondary | ICD-10-CM | POA: Insufficient documentation

## 2019-02-22 DIAGNOSIS — Z20822 Contact with and (suspected) exposure to covid-19: Secondary | ICD-10-CM | POA: Diagnosis not present

## 2019-02-22 LAB — SARS CORONAVIRUS 2 (TAT 6-24 HRS): SARS Coronavirus 2: NEGATIVE

## 2019-02-25 ENCOUNTER — Other Ambulatory Visit: Payer: Self-pay | Admitting: Certified Nurse Midwife

## 2019-02-26 ENCOUNTER — Encounter: Payer: Self-pay | Admitting: Certified Nurse Midwife

## 2019-02-26 ENCOUNTER — Inpatient Hospital Stay: Payer: Medicaid Other | Admitting: Anesthesiology

## 2019-02-26 ENCOUNTER — Other Ambulatory Visit: Payer: Self-pay

## 2019-02-26 ENCOUNTER — Inpatient Hospital Stay
Admission: EM | Admit: 2019-02-26 | Discharge: 2019-03-01 | DRG: 806 | Disposition: A | Discharge status: Home / Self Care | Payer: Medicaid Other | Attending: Certified Nurse Midwife | Admitting: Certified Nurse Midwife

## 2019-02-26 DIAGNOSIS — O9081 Anemia of the puerperium: Secondary | ICD-10-CM | POA: Diagnosis not present

## 2019-02-26 DIAGNOSIS — Z3A37 37 weeks gestation of pregnancy: Secondary | ICD-10-CM

## 2019-02-26 DIAGNOSIS — O9912 Other diseases of the blood and blood-forming organs and certain disorders involving the immune mechanism complicating childbirth: Secondary | ICD-10-CM | POA: Diagnosis not present

## 2019-02-26 DIAGNOSIS — O1002 Pre-existing essential hypertension complicating childbirth: Secondary | ICD-10-CM | POA: Diagnosis not present

## 2019-02-26 DIAGNOSIS — D500 Iron deficiency anemia secondary to blood loss (chronic): Secondary | ICD-10-CM | POA: Diagnosis not present

## 2019-02-26 DIAGNOSIS — O99344 Other mental disorders complicating childbirth: Secondary | ICD-10-CM | POA: Diagnosis present

## 2019-02-26 DIAGNOSIS — O34219 Maternal care for unspecified type scar from previous cesarean delivery: Secondary | ICD-10-CM | POA: Diagnosis present

## 2019-02-26 DIAGNOSIS — F329 Major depressive disorder, single episode, unspecified: Secondary | ICD-10-CM | POA: Diagnosis not present

## 2019-02-26 DIAGNOSIS — D62 Acute posthemorrhagic anemia: Secondary | ICD-10-CM | POA: Diagnosis not present

## 2019-02-26 DIAGNOSIS — D759 Disease of blood and blood-forming organs, unspecified: Secondary | ICD-10-CM | POA: Diagnosis not present

## 2019-02-26 DIAGNOSIS — D6852 Prothrombin gene mutation: Secondary | ICD-10-CM | POA: Diagnosis present

## 2019-02-26 DIAGNOSIS — Z87891 Personal history of nicotine dependence: Secondary | ICD-10-CM

## 2019-02-26 DIAGNOSIS — O10919 Unspecified pre-existing hypertension complicating pregnancy, unspecified trimester: Secondary | ICD-10-CM | POA: Diagnosis present

## 2019-02-26 DIAGNOSIS — F411 Generalized anxiety disorder: Secondary | ICD-10-CM | POA: Diagnosis present

## 2019-02-26 HISTORY — DX: Anxiety disorder, unspecified: F41.9

## 2019-02-26 LAB — CBC
HCT: 30.9 % — ABNORMAL LOW (ref 36.0–46.0)
Hemoglobin: 9.9 g/dL — ABNORMAL LOW (ref 12.0–15.0)
MCH: 24.6 pg — ABNORMAL LOW (ref 26.0–34.0)
MCHC: 32 g/dL (ref 30.0–36.0)
MCV: 76.7 fL — ABNORMAL LOW (ref 80.0–100.0)
Platelets: 277 10*3/uL (ref 150–400)
RBC: 4.03 MIL/uL (ref 3.87–5.11)
RDW: 13.9 % (ref 11.5–15.5)
WBC: 8.8 10*3/uL (ref 4.0–10.5)
nRBC: 0 % (ref 0.0–0.2)

## 2019-02-26 LAB — COMPREHENSIVE METABOLIC PANEL
ALT: 9 U/L (ref 0–44)
AST: 12 U/L — ABNORMAL LOW (ref 15–41)
Albumin: 2.9 g/dL — ABNORMAL LOW (ref 3.5–5.0)
Alkaline Phosphatase: 74 U/L (ref 38–126)
Anion gap: 9 (ref 5–15)
BUN: 7 mg/dL (ref 6–20)
CO2: 21 mmol/L — ABNORMAL LOW (ref 22–32)
Calcium: 8.7 mg/dL — ABNORMAL LOW (ref 8.9–10.3)
Chloride: 106 mmol/L (ref 98–111)
Creatinine, Ser: 0.58 mg/dL (ref 0.44–1.00)
GFR calc Af Amer: >60 mL/min (ref >60)
GFR calc non Af Amer: >60 mL/min (ref >60)
Glucose, Bld: 82 mg/dL (ref 70–99)
Potassium: 3.7 mmol/L (ref 3.5–5.1)
Sodium: 136 mmol/L (ref 135–145)
Total Bilirubin: 0.4 mg/dL (ref 0.3–1.2)
Total Protein: 6 g/dL — ABNORMAL LOW (ref 6.5–8.1)

## 2019-02-26 LAB — PROTEIN / CREATININE RATIO, URINE
Creatinine, Urine: 127 mg/dL
Protein Creatinine Ratio: 0.06 mg/mg{Cre} (ref 0.00–0.15)
Total Protein, Urine: 7 mg/dL

## 2019-02-26 LAB — TYPE AND SCREEN
ABO/RH(D): A POS
Antibody Screen: NEGATIVE

## 2019-02-26 MED ORDER — FENTANYL 2.5 MCG/ML W/ROPIVACAINE 0.15% IN NS 100 ML EPIDURAL (ARMC)
EPIDURAL | Status: AC
  Filled 2019-02-26: qty 100

## 2019-02-26 MED ORDER — PHENYLEPHRINE 40 MCG/ML (10ML) SYRINGE FOR IV PUSH (FOR BLOOD PRESSURE SUPPORT)
80.0000 ug | PREFILLED_SYRINGE | INTRAVENOUS | Status: DC | PRN
  Filled 2019-02-26: qty 10

## 2019-02-26 MED ORDER — OXYTOCIN 40 UNITS IN NORMAL SALINE INFUSION - SIMPLE MED
2.5000 [IU]/h | INTRAVENOUS | Status: DC
  Administered 2019-02-27 (×2): 2.5 [IU]/h via INTRAVENOUS

## 2019-02-26 MED ORDER — FENTANYL CITRATE (PF) 100 MCG/2ML IJ SOLN
INTRAMUSCULAR | Status: AC
  Administered 2019-02-26: 22:00:00 50 ug via INTRAVENOUS
  Filled 2019-02-26: qty 2

## 2019-02-26 MED ORDER — SODIUM CHLORIDE 0.9 % IV SOLN
INTRAVENOUS | Status: DC | PRN
  Administered 2019-02-26 (×3): 5 mL via EPIDURAL

## 2019-02-26 MED ORDER — FENTANYL CITRATE (PF) 100 MCG/2ML IJ SOLN: 50.0000 ug | Freq: Once | INTRAMUSCULAR | Status: AC

## 2019-02-26 MED ORDER — SOD CITRATE-CITRIC ACID 500-334 MG/5ML PO SOLN: 30.0000 mL | ORAL | Status: DC | PRN

## 2019-02-26 MED ORDER — ACETAMINOPHEN 325 MG PO TABS: 650.0000 mg | ORAL_TABLET | ORAL | Status: DC | PRN

## 2019-02-26 MED ORDER — TERBUTALINE SULFATE 1 MG/ML IJ SOLN: 0.2500 mg | Freq: Once | INTRAMUSCULAR | Status: DC | PRN

## 2019-02-26 MED ORDER — MISOPROSTOL 100 MCG PO TABS
25.0000 ug | ORAL_TABLET | ORAL | Status: DC | PRN
  Administered 2019-02-26 (×2): 25 ug via VAGINAL
  Filled 2019-02-26 (×5): qty 1

## 2019-02-26 MED ORDER — CITALOPRAM HYDROBROMIDE 20 MG PO TABS
20.0000 mg | ORAL_TABLET | Freq: Every day | ORAL | Status: DC
  Administered 2019-02-26: 11:00:00 20 mg via ORAL
  Filled 2019-02-26: qty 1

## 2019-02-26 MED ORDER — ONDANSETRON HCL 4 MG/2ML IJ SOLN: 4.0000 mg | Freq: Four times a day (QID) | INTRAMUSCULAR | Status: DC | PRN

## 2019-02-26 MED ORDER — MISOPROSTOL 200 MCG PO TABS
ORAL_TABLET | ORAL | Status: AC
  Administered 2019-02-26: 25 ug via BUCCAL
  Filled 2019-02-26: qty 4

## 2019-02-26 MED ORDER — MISOPROSTOL 50MCG HALF TABLET
ORAL_TABLET | ORAL | Status: AC
  Filled 2019-02-26: qty 1

## 2019-02-26 MED ORDER — LORAZEPAM 2 MG/ML IJ SOLN: 1.0000 mg | Freq: Once | INTRAMUSCULAR | Status: AC

## 2019-02-26 MED ORDER — OXYTOCIN 10 UNIT/ML IJ SOLN
INTRAMUSCULAR | Status: AC
  Filled 2019-02-26: qty 2

## 2019-02-26 MED ORDER — MISOPROSTOL 100 MCG PO TABS
25.0000 ug | ORAL_TABLET | ORAL | Status: DC
  Administered 2019-02-26 (×2): 25 ug via BUCCAL
  Filled 2019-02-26 (×11): qty 1

## 2019-02-26 MED ORDER — LACTATED RINGERS IV SOLN: 500.0000 mL | INTRAVENOUS | Status: DC | PRN

## 2019-02-26 MED ORDER — LACTATED RINGERS IV SOLN: INTRAVENOUS | Status: DC

## 2019-02-26 MED ORDER — OXYTOCIN BOLUS FROM INFUSION
500.0000 mL | Freq: Once | INTRAVENOUS | Status: AC
  Administered 2019-02-27: 11:00:00 500 mL via INTRAVENOUS

## 2019-02-26 MED ORDER — EPHEDRINE 5 MG/ML INJ
10.0000 mg | INTRAVENOUS | Status: DC | PRN
  Filled 2019-02-26: qty 2

## 2019-02-26 MED ORDER — FENTANYL 2.5 MCG/ML W/ROPIVACAINE 0.15% IN NS 100 ML EPIDURAL (ARMC)
12.0000 mL/h | EPIDURAL | Status: DC
  Filled 2019-02-26: qty 100

## 2019-02-26 MED ORDER — DIPHENHYDRAMINE HCL 50 MG/ML IJ SOLN
12.5000 mg | INTRAMUSCULAR | Status: DC | PRN
  Administered 2019-02-27: 12.5 mg via INTRAVENOUS
  Filled 2019-02-26: qty 1

## 2019-02-26 MED ORDER — LIDOCAINE HCL (PF) 1 % IJ SOLN
30.0000 mL | INTRAMUSCULAR | Status: AC | PRN
  Administered 2019-02-27: 11:00:00 30 mL via SUBCUTANEOUS
  Filled 2019-02-26: qty 30

## 2019-02-26 MED ORDER — LIDOCAINE HCL (PF) 1 % IJ SOLN
INTRAMUSCULAR | Status: DC | PRN
  Administered 2019-02-26 (×2): 2 mL

## 2019-02-26 MED ORDER — LACTATED RINGERS IV SOLN: 500.0000 mL | Freq: Once | INTRAVENOUS | Status: DC

## 2019-02-26 MED ORDER — OXYTOCIN 40 UNITS IN NORMAL SALINE INFUSION - SIMPLE MED
1.0000 m[IU]/min | INTRAVENOUS | Status: DC
  Administered 2019-02-26: 2 m[IU]/min via INTRAVENOUS
  Filled 2019-02-26: qty 1000

## 2019-02-26 MED ORDER — LORAZEPAM 2 MG/ML IJ SOLN
INTRAMUSCULAR | Status: AC
  Administered 2019-02-26: 22:00:00 1 mg via INTRAVENOUS
  Filled 2019-02-26: qty 1

## 2019-02-26 MED ORDER — AMMONIA AROMATIC IN INHA
RESPIRATORY_TRACT | Status: AC
  Filled 2019-02-26: qty 10

## 2019-02-26 MED ORDER — FENTANYL 2.5 MCG/ML W/ROPIVACAINE 0.15% IN NS 100 ML EPIDURAL (ARMC)
EPIDURAL | Status: DC | PRN
  Administered 2019-02-26: 12 mL/h via EPIDURAL

## 2019-02-26 NOTE — Anesthesia Procedure Notes (Signed)
Epidural Patient location during procedure: OB Start time: 02/26/2019 9:26 PM End time: 02/26/2019 10:04 PM  Staffing Anesthesiologist: Karleen Hampshire, MD Performed: anesthesiologist   Preanesthetic Checklist Completed: patient identified, IV checked, site marked, risks and benefits discussed, surgical consent, monitors and equipment checked, pre-op evaluation and timeout performed  Epidural Patient position: sitting Prep: ChloraPrep Patient monitoring: heart rate, continuous pulse ox and blood pressure Approach: midline Location: L4-L5 Injection technique: LOR saline  Needle:  Needle type: Tuohy  Needle gauge: 18 G Needle length: 9 cm and 9 Needle insertion depth: 7 cm Catheter type: closed end flexible Catheter size: 20 Guage Catheter at skin depth: 12 cm Test dose: negative and Other  Assessment Events: blood not aspirated, injection not painful, no injection resistance, no paresthesia and negative IV test  Additional Notes Risks and benefits of procedure discussed with patient.  Risks including but not limited to infection, spinal/epidural hematoma, nerve injury, post dural puncture headache, and inadequate/failed block.  Patient expressed understanding and consented to epidural placement.  Several attempts were required.  Patient had severe anxiety during the procedure (crying, shaking) and required administration of a low dose analgesic and anxiolytic to calm down enough to proceed.  Negative dural puncture.  Negative aspiration.  Negative paresthesia on injection of medication.  Dose given in divided aliquots.  Patient suffered no immediate complications.   Reason for block:procedure for pain

## 2019-02-26 NOTE — Progress Notes (Signed)
[name] is a 24 y.o. G1P0000 at [redacted]w[redacted]d iol for chronic HTN, s/p multiple doses cytotec, now on pitocin  Subjective: Feeling contractions slightly  Objective: BP 134/81   Pulse (!) 105   Temp 98.1 F (36.7 C) (Oral)   Resp 16   Ht 5\' 9"  (1.753 m)   Wt 117.9 kg   LMP 06/12/2018   BMI 38.40 kg/m  I/O last 3 completed shifts: In: 1029.1 [I.V.:1029.1] Out: 200 [Urine:200] No intake/output data recorded.  FHT:  FHR: 140 bpm, variability: moderate,  accelerations:  Present,  decelerations:  Absent UC:   irregular, every 3-5 minutes SVE:   Dilation: 4 Effacement (%): 70 Station: -2 Exam by:: Oxley CNM She is 3-4 on my check, 50% effaced and well applied  Labs: Lab Results  Component Value Date   WBC 8.8 02/26/2019   HGB 9.9 (L) 02/26/2019   HCT 30.9 (L) 02/26/2019   MCV 76.7 (L) 02/26/2019   PLT 277 02/26/2019    Assessment / Plan: Induction of labor 2/2 to cHTN, on pitocin in early labor. Continue pitocin and AROM when clinically appropriate  Labor: still in early labor, contractions not regular Fetal Wellbeing:  Category I Pain Control:  Labor support without medications and she desires epidural  I/D:  n/a Anticipated MOD:  NSVD  04/26/2019 02/26/2019, 7:17 PM

## 2019-02-26 NOTE — Progress Notes (Signed)
Patient resting in her room. Pt states she has some "mild cramping but nothing super painful." Patient has been resting most of the morning. RN at bedside often palpating contractions.

## 2019-02-26 NOTE — Progress Notes (Signed)
Pt having a panic attack related to epidural, sobbing and unable to follow directions. Reassurance given and medications ordered to help pt relax. Pt cooperative and attempting to follow MDA

## 2019-02-26 NOTE — H&P (Signed)
OB History & Physical   History of Present Illness:  Chief Complaint: presents for IOl  HPI:  [name] is a 24 y.o. G1P0000 female at 102w0d dated by LMP c/w [redacted]w[redacted]d ultrasound. She presents to L&D for planned induction of labor due to hypertension in pregnancy.   She reports:  -active fetal movement -no leakage of fluid  -some bloody show after last cervical exam  Pregnancy Issues: 1. Intermittent nausea and vomiting, resolved early second trimester, started again at 30 weeks, labs (CMP, amylase, lipase) WNL 2. 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 Chronic hypertension, started labetalol 200mg  BID 09/12/2018 4. Prepregnancy BMI 37.8 5. Anxiety/deperession on Celexa 20mg  daily   Maternal Medical History:   Past Medical History:  Diagnosis Date  . Anxiety   . Chlamydia 6/15, 11/15, 2/16  . Clotting disorder (HCC) 2012   PROTHROMBIN GENE MUTATION AND fACTOR viii - NO ESTROGEN PRODUCTS  . Esophageal reflux   . Family history of breast cancer 05/08/2013   WILL DISCUSS GENETIC TESTING WHEN PT TURNS 21  . Hypertension   . Immunization, viral disease    GARDASIL SERIES COMPLETED  . Irregular menses 03/19/2012  . Menorrhagia 2012   OFF BC  . Mononucleosis 01/08/2014   [facility]  . Pre-diabetes 2014   [facility] IN THE PAST  . Proctalgia    Intermittently    Past Surgical History:  Procedure Laterality Date  . WISDOM TOOTH EXTRACTION      Allergies  Allergen Reactions  . Estrogens Other (See Comments)    Hx clotting disorder    Prior to Admission medications   Medication Sig Start Date End Date Taking? Authorizing Provider  aspirin 81 MG chewable tablet Chew 81 mg by mouth daily.    [provider]  citalopram (CELEXA) 20 MG tablet Take 20 mg by mouth daily.    [provider]  enoxaparin (LOVENOX) 40 MG/0.4ML injection Inject 0.4 mLs (40 mg total) into the skin at bedtime. 09/07/18   01/10/2014, CNM  famotidine (PEPCID) 20 MG tablet Take 20 mg by mouth 2 (two) times daily.    [provider]  Prenatal Vit-Fe Fumarate-FA (PRENATAL MULTIVITAMIN) TABS tablet Take 1 tablet by mouth daily at 12 noon.    [provider]     Prenatal care site: [facility]   Social History: She  reports that she quit smoking about 3 years ago. Her smoking use included cigarettes. She has never used smokeless tobacco. She reports previous alcohol use. She reports that she does not use drugs.  Family History: family history includes Breast cancer in her paternal aunt; Clotting disorder in her mother; Heart attack in her maternal grandfather; Heart disease in her maternal grandfather; Hypertension in her maternal grandmother; Kidney disease in her sister; Stroke in her maternal grandfather.   Review of Systems: A full review of systems was performed and negative except as noted in the HPI.    Physical Exam:  Vital Signs: BP (!) 104/49   Pulse (!) 101   Temp 98.2 F (36.8 C) (Oral)   Resp 14   Ht 5\' 9"  (1.753 m)   Wt 117.9 kg   LMP 06/12/2018   BMI 38.40 kg/m   General:   alert, cooperative, appears stated age and no distress  Skin:  normal  and no rash or abnormalities  Neurologic:    Alert & oriented x 3  Lungs:   clear to auscultation bilaterally  Heart:   regular rate and rhythm, S1, S2 normal, no murmur, click, rub or gallop  Abdomen:  soft, non-tender; bowel sounds normal; no masses,  no organomegaly  Pelvis:  Exam deferred.  FHT:  145 BPM  Presentations: cephalic  Cervix:  [id number] per RN Carver Fila   Dilation: 2-3cm   Effacement: 70%   Station:  -2   Consistency: soft   Position: middle  Extremities: : non-tender, symmetric, no edema bilaterally.     EFW: ultrasound 02/20/2019: EFW=6lb8oz (2952g)=51%  Results for  orders placed or performed during the hospital encounter of 02/26/19 (from the past 24 hour(s))  Type and screen     Status: None   Collection Time: 02/26/19 12:57 AM  Result Value Ref Range   ABO/RH(D) A POS    Antibody Screen NEG    Sample Expiration      03/01/2019,2359 Performed at [facility], [address] [place], [place] [address]   CBC     Status: Abnormal   Collection Time: 02/26/19  1:02 AM  Result Value Ref Range   WBC 8.8 4.0 - 10.5 K/uL   RBC 4.03 3.87 - 5.11 MIL/uL   Hemoglobin 9.9 (L) 12.0 - 15.0 g/dL   HCT 30.9 (L) 36.0 - 46.0 %   MCV 76.7 (L) 80.0 - 100.0 fL   MCH 24.6 (L) 26.0 - 34.0 pg   MCHC 32.0 30.0 - 36.0 g/dL   RDW 13.9 11.5 - 15.5 %   Platelets 277 150 - 400 K/uL   nRBC 0.0 0.0 - 0.2 %    Pertinent Results:  Prenatal Labs: Blood type/Rh A+  Antibody screen neg  Rubella Immune  Varicella Immune  RPR NR  HBsAg Neg  HIV NR  GC neg  Chlamydia neg  Genetic screening negative  1 hour GTT 97  3 hour GTT n/a  GBS negative   FHT: FHR: 140 bpm, variability: moderate,  accelerations:  Present,  decelerations:  Absent Category/reactivity:  Category I TOCO: none  Assessment:  [name] is a 24 y.o. G73P0000 female at [redacted]w[redacted]d with induction of labor for hypertension.   Plan:  1. Admit to Labor & Delivery; consents reviewed and obtained  2. Fetal Well being  - Fetal Tracing: category I - GBS negative - Presentation: cephalic confirmed by Leopold's   3. Routine OB: - Prenatal labs reviewed, as above - Rh positive - CBC & T&S on admit - Clear fluids, IVF  4. Induction of Labor: -  Contractions monitored with external toco in place -  Plan for induction with misoprostol. Discussed placement of balloon (foley or Cook), which patient declines at this point.  -  Plan for continuous fetal monitoring  -  Maternal pain control as desired: IVPM, regional anesthesia - Anticipate vaginal delivery  5. Post Partum Planning: - Infant  feeding: pumping and formula - Contraception: condoms/withdrawal  Lisette Grinder, CNM 02/26/2019 6:18 AM ----- Lisette Grinder Certified Nurse Midwife [facility], Department of [facility]

## 2019-02-26 NOTE — Progress Notes (Addendum)
0040 SVE 2/70/-2 0055 Cytotec Buccal and PV 0505 Cytotec Buccal 0520 SVE 2.5/70/-2 0925 SVE 2.5/70/-2 0937 Cytotec Buccal and PV   Labor Progress Note  [name] is a 24 y.o. G1P0000 at [redacted]w[redacted]d by LMP admitted for induction of labor due to chronic hypertension.  Subjective: Pt is laying in bed, she continues to reports that she is very tired. Denies UCs/cramping, bleeding, or LOF. FOB in room asleep.  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

## 2019-02-26 NOTE — Progress Notes (Signed)
RN at bedside from 0720 to 0732 adjusting fetal monitors. Due to maternal position on left lateral external fetal monitor was having trouble tracing fht. RN placed Pod monitor on patient and was able to assess contractions well but FHT was only trace in short segments. [phone] external fetal monitors applied and FHT 145 but unable to assess contractions. Pt denies pain currently and abdomen soft to palpation.

## 2019-02-26 NOTE — Progress Notes (Signed)
0040 SVE 2/70/-2 0055 Cytotec Buccal and PV 0505 Cytotec Buccal 0520 SVE 2.5/70/-2 0925 SVE 2.5/70/-2 0937 Cytotec Buccal and PV 1354 SVE 3/70/-2 Mid 1740 SVE 4/70/-2 1745 Pitocin started  Labor Progress Note  [name] is a 24 y.o. G1P0000 at [redacted]w[redacted]d by LMP admitted for induction of labor due to chronic hypertension.  Subjective: Pt is laying in bed. Reports cramping has increased, and continued bloody show.  Denies LOF. FOB in room and supportive.  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

## 2019-02-26 NOTE — Progress Notes (Signed)
0040 SVE 2/70/-2 0055 Cytotec Buccal and PV 0505 Cytotec Buccal 0520 SVE 2.5/70/-2 0925 SVE 2.5/70/-2 0937 Cytotec Buccal and PV 1354 SVE 3/70/-2 Mid 1740 SVE 4/70/-2 1745 Pitocin started  Labor Progress Note  [name] is a 24 y.o. G1P0000 at [redacted]w[redacted]d by LMP admitted for induction of labor due to chronic hypertension.  Subjective: Pt is laying in bed. Epidural placed, anxiety attack occurred during placement.  Pt still tearful. FOB showering.  Pt continues to have difficulty tolerating SVE well despite epidural.  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

## 2019-02-26 NOTE — Anesthesia Preprocedure Evaluation (Signed)
Anesthesia Evaluation  Patient identified by MRN, date of birth, ID band Patient awake    Reviewed: Allergy & Precautions, H&P , NPO status , Patient's Chart, lab work & pertinent test results  Airway Mallampati: III  TM Distance: >3 FB Neck ROM: full    Dental  (+) Teeth Intact   Pulmonary former smoker,           Cardiovascular Exercise Tolerance: Good hypertension (gestational HTN), On Medications and On Home Beta Blockers      Neuro/Psych PSYCHIATRIC DISORDERS Anxiety Depression    GI/Hepatic GERD  ,  Endo/Other    Renal/GU   negative genitourinary   Musculoskeletal   Abdominal   Peds  Hematology  (+) Blood dyscrasia, , Pt reports "Factor VIII and prothrombin" mutations resulting in a pro-coagulant state.  Her mother had the same mutation.  Pt has never had any manifestations such as DVT or PE.  She is taking Lovenox injections during this pregnancy, with the last dose >24 hrs ago   Anesthesia Other Findings Obesity  Past Medical History: No date: Anxiety 6/15, 11/15, 2/16: Chlamydia 2012: Clotting disorder (HCC)     Comment:  PROTHROMBIN GENE MUTATION AND fACTOR viii - NO ESTROGEN               PRODUCTS No date: Esophageal reflux 05/08/2013: Family history of breast cancer     Comment:  WILL DISCUSS GENETIC TESTING WHEN PT TURNS 21 No date: Hypertension No date: Immunization, viral disease     Comment:  GARDASIL SERIES COMPLETED 03/19/2012: Irregular menses 2012: Menorrhagia     Comment:  OFF BC 01/08/2014: Mononucleosis     Comment:  [facility] 2014: Pre-diabetes     Comment:  [facility] IN THE PAST No date: Proctalgia     Comment:  Intermittently  Past Surgical History: No date: WISDOM TOOTH EXTRACTION  BMI    Body Mass Index: 38.40 kg/m      Reproductive/Obstetrics (+) Pregnancy                             Anesthesia Physical Anesthesia Plan  ASA:  III  Anesthesia Plan: Epidural   Post-op Pain Management:    Induction:   PONV Risk Score and Plan:   Airway Management Planned:   Additional Equipment:   Intra-op Plan:   Post-operative Plan:   Informed Consent: I have reviewed the patients History and Physical, chart, labs and discussed the procedure including the risks, benefits and alternatives for the proposed anesthesia with the patient or authorized representative who has indicated his/her understanding and acceptance.       Plan Discussed with: Anesthesiologist  Anesthesia Plan Comments:         Anesthesia Quick Evaluation

## 2019-02-26 NOTE — Progress Notes (Signed)
0040 SVE 2/70/-2 0055 Cytotec Buccal and PV 0505 Cytotec Buccal 0520 SVE 2.5/70/-2  Labor Progress Note  [name] is a 24 y.o. G1P0000 at [redacted]w[redacted]d by LMP admitted for induction of labor due to gestational hypertension.  Subjective: Pt is laying in bed, reports she is very tired. Denies UCs/cramping, bleeding, or LOF. FOB in room supportive.  Objective: BP 137/70   Pulse (!) 110   Temp 97.8 F (36.6 C) (Oral)   Resp 17   Ht 5\' 9"  (1.753 m)   Wt 117.9 kg   LMP 06/12/2018   BMI 38.40 kg/m   Fetal Assessment: FHT:  FHR: 145 bpm, variability: moderate,  accelerations:  Present,  decelerations:  Absent Category/reactivity:  Category I UC:   Irregular and mild SVE:   deferred Membrane status: Intact Amniotic color: n/a  Labs: Lab Results  Component Value Date   WBC 8.8 02/26/2019   HGB 9.9 (L) 02/26/2019   HCT 30.9 (L) 02/26/2019   MCV 76.7 (L) 02/26/2019   PLT 277 02/26/2019    Assessment / Plan: Induction of labor due to gestational hypertension,  progressing with Cytotec  Labor: Cervical Ripening Preeclampsia:  no signs or symptoms of toxicity, 137/70 Fetal Wellbeing:  Category I Pain Control:  Labor support without medications I/D:  GBS neg, Afibrile Anticipated MOD:  NSVD  04/26/2019, CNM 02/26/2019, 8:31 AM

## 2019-02-26 NOTE — Progress Notes (Signed)
Pt is sitting on birthing ball beside the bed. Pt states the contractions are a little more intense now and states "It feels like the baby has dropped lower and I have more pressure." Patient still contracting every 2-4 minutes by palpation and contractions are palpating mild/moderate.

## 2019-02-26 NOTE — Progress Notes (Signed)
0040 SVE 2/70/-2 0055 Cytotec 91mcg Buccal and 41mcg PV 0505 Cytotec 2mch Buccal 0520 SVE 2.5/70/-2 0925 SVE 2.5/70/-2 0937 Cytotec 64mcg Buccal and 28mcg PV 1354 SVE 3/70/-2 Mid  Labor Progress Note  [name] is a 24 y.o. G1P0000 at [redacted]w[redacted]d by LMP admitted for induction of labor due to chronic hypertension.  Subjective: Pt is laying in bed. Reports cramping, and bloody show.  Denies LOF. FOB in room and supportive.  Objective: BP 134/81   Pulse (!) 105   Temp 98.1 F (36.7 C) (Oral)   Resp 16   Ht 5\' 9"  (1.753 m)   Wt 117.9 kg   LMP 06/12/2018   BMI 38.40 kg/m   Fetal Assessment: FHT:  FHR: 140 bpm, variability: moderate,  accelerations:  Present,  decelerations:  Absent Category/reactivity:  Category I UC:  Regular Q2 min SVE:    Dilation: 3cm  Effacement: 70%  Station:  -2  Consistency: medium  Position: middle  Membrane status: Intact Amniotic color: n/a  Results for orders placed or performed during the hospital encounter of 02/26/19 (from the past 24 hour(s))  Type and screen     Status: None   Collection Time: 02/26/19 12:57 AM  Result Value Ref Range   ABO/RH(D) A POS    Antibody Screen NEG    Sample Expiration      03/01/2019,2359 Performed at [facility], [address] [place], [place] [address]   CBC     Status: Abnormal   Collection Time: 02/26/19  1:02 AM  Result Value Ref Range   WBC 8.8 4.0 - 10.5 K/uL   RBC 4.03 3.87 - 5.11 MIL/uL   Hemoglobin 9.9 (L) 12.0 - 15.0 g/dL   HCT 30.9 (L) 36.0 - 46.0 %   MCV 76.7 (L) 80.0 - 100.0 fL   MCH 24.6 (L) 26.0 - 34.0 pg   MCHC 32.0 30.0 - 36.0 g/dL   RDW 13.9 11.5 - 15.5 %   Platelets 277 150 - 400 K/uL   nRBC 0.0 0.0 - 0.2 %  Protein / creatinine ratio, urine     Status: None   Collection Time: 02/26/19  1:02 AM  Result Value Ref Range   Creatinine, Urine 127 mg/dL   Total Protein, Urine 7 mg/dL   Protein Creatinine Ratio 0.06 0.00 - 0.15 mg/mg[Cre]  Comprehensive metabolic panel      Status: Abnormal   Collection Time: 02/26/19 11:02 AM  Result Value Ref Range   Sodium 136 135 - 145 mmol/L   Potassium 3.7 3.5 - 5.1 mmol/L   Chloride 106 98 - 111 mmol/L   CO2 21 (L) 22 - 32 mmol/L   Glucose, Bld 82 70 - 99 mg/dL   BUN 7 6 - 20 mg/dL   Creatinine, Ser 0.58 0.44 - 1.00 mg/dL   Calcium 8.7 (L) 8.9 - 10.3 mg/dL   Total Protein 6.0 (L) 6.5 - 8.1 g/dL   Albumin 2.9 (L) 3.5 - 5.0 g/dL   AST 12 (L) 15 - 41 U/L   ALT 9 0 - 44 U/L   Alkaline Phosphatase 74 38 - 126 U/L   Total Bilirubin 0.4 0.3 - 1.2 mg/dL   GFR calc non Af Amer >60 >60 mL/min   GFR calc Af Amer >60 >60 mL/min   Anion gap 9 5 - 15    Assessment / Plan: Induction of labor due to gestational hypertension, discussed options: 1) Another dose of Cytotec 2) Cook Cath 3) Pitocin  Pt  voiced concern that placing a Cook cath would be too painful. An epidural was offered prior to placement however pt voiced that she was concerned about getting an epidural at this time. Offered her time to talk things over with her husband.  She was encouraged to get out of bed to walk or spend time on the birth ball.  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

## 2019-02-27 ENCOUNTER — Encounter: Payer: Self-pay | Admitting: Obstetrics & Gynecology

## 2019-02-27 DIAGNOSIS — Z8759 Personal history of other complications of pregnancy, childbirth and the puerperium: Secondary | ICD-10-CM

## 2019-02-27 LAB — CBC
HCT: 26.9 % — ABNORMAL LOW (ref 36.0–46.0)
Hemoglobin: 8.5 g/dL — ABNORMAL LOW (ref 12.0–15.0)
MCH: 24.6 pg — ABNORMAL LOW (ref 26.0–34.0)
MCHC: 31.6 g/dL (ref 30.0–36.0)
MCV: 78 fL — ABNORMAL LOW (ref 80.0–100.0)
Platelets: 262 10*3/uL (ref 150–400)
RBC: 3.45 MIL/uL — ABNORMAL LOW (ref 3.87–5.11)
RDW: 13.9 % (ref 11.5–15.5)
WBC: 11.6 10*3/uL — ABNORMAL HIGH (ref 4.0–10.5)
nRBC: 0 % (ref 0.0–0.2)

## 2019-02-27 LAB — CREATININE, SERUM
Creatinine, Ser: 0.71 mg/dL (ref 0.44–1.00)
GFR calc Af Amer: >60 mL/min (ref >60)
GFR calc non Af Amer: >60 mL/min (ref >60)

## 2019-02-27 LAB — RPR: RPR Ser Ql: NONREACTIVE

## 2019-02-27 MED ORDER — FERROUS SULFATE 325 (65 FE) MG PO TABS
325.0000 mg | ORAL_TABLET | Freq: Three times a day (TID) | ORAL | Status: DC
  Administered 2019-02-27 – 2019-03-01 (×5): 325 mg via ORAL
  Filled 2019-02-27 (×5): qty 1

## 2019-02-27 MED ORDER — PANTOPRAZOLE SODIUM 20 MG PO TBEC
20.0000 mg | DELAYED_RELEASE_TABLET | Freq: Every day | ORAL | Status: DC
  Administered 2019-02-27 – 2019-03-01 (×3): 20 mg via ORAL
  Filled 2019-02-27 (×3): qty 1

## 2019-02-27 MED ORDER — IBUPROFEN 600 MG PO TABS
600.0000 mg | ORAL_TABLET | Freq: Four times a day (QID) | ORAL | Status: DC
  Administered 2019-02-27 – 2019-03-01 (×7): 600 mg via ORAL
  Filled 2019-02-27 (×8): qty 1

## 2019-02-27 MED ORDER — BENZOCAINE-MENTHOL 20-0.5 % EX AERO
1.0000 "application " | INHALATION_SPRAY | CUTANEOUS | Status: DC | PRN
  Administered 2019-02-27 – 2019-03-01 (×2): 1 via TOPICAL
  Filled 2019-02-27 (×2): qty 56

## 2019-02-27 MED ORDER — DIBUCAINE (PERIANAL) 1 % EX OINT: 1.0000 "application " | TOPICAL_OINTMENT | CUTANEOUS | Status: DC | PRN

## 2019-02-27 MED ORDER — ACETAMINOPHEN 325 MG PO TABS
650.0000 mg | ORAL_TABLET | ORAL | Status: DC | PRN
  Administered 2019-02-27 – 2019-03-01 (×5): 650 mg via ORAL
  Filled 2019-02-27 (×5): qty 2

## 2019-02-27 MED ORDER — ASCORBIC ACID 500 MG PO TABS
500.0000 mg | ORAL_TABLET | Freq: Three times a day (TID) | ORAL | Status: DC
  Administered 2019-02-27 – 2019-03-01 (×5): 500 mg via ORAL
  Filled 2019-02-27 (×5): qty 1

## 2019-02-27 MED ORDER — LABETALOL HCL 5 MG/ML IV SOLN: 20.0000 mg | Freq: Once | INTRAVENOUS | Status: DC

## 2019-02-27 MED ORDER — OXYTOCIN 40 UNITS IN NORMAL SALINE INFUSION - SIMPLE MED
INTRAVENOUS | Status: AC
  Filled 2019-02-27: qty 1000

## 2019-02-27 MED ORDER — CITALOPRAM HYDROBROMIDE 20 MG PO TABS
20.0000 mg | ORAL_TABLET | Freq: Every day | ORAL | Status: DC
  Administered 2019-02-27 – 2019-03-01 (×3): 20 mg via ORAL
  Filled 2019-02-27 (×4): qty 1

## 2019-02-27 MED ORDER — SIMETHICONE 80 MG PO CHEW: 80.0000 mg | CHEWABLE_TABLET | ORAL | Status: DC | PRN

## 2019-02-27 MED ORDER — FERROUS SULFATE 325 (65 FE) MG PO TABS: 325.0000 mg | ORAL_TABLET | Freq: Two times a day (BID) | ORAL | Status: DC

## 2019-02-27 MED ORDER — DIPHENHYDRAMINE HCL 25 MG PO CAPS: 25.0000 mg | ORAL_CAPSULE | Freq: Four times a day (QID) | ORAL | Status: DC | PRN

## 2019-02-27 MED ORDER — FENTANYL CITRATE (PF) 100 MCG/2ML IJ SOLN
INTRAMUSCULAR | Status: AC
  Administered 2019-02-27: 11:00:00 50 ug via INTRAVENOUS
  Filled 2019-02-27: qty 2

## 2019-02-27 MED ORDER — ZOLPIDEM TARTRATE 5 MG PO TABS: 5.0000 mg | ORAL_TABLET | Freq: Every evening | ORAL | Status: DC | PRN

## 2019-02-27 MED ORDER — SENNOSIDES-DOCUSATE SODIUM 8.6-50 MG PO TABS
2.0000 | ORAL_TABLET | ORAL | Status: DC
  Administered 2019-02-28 – 2019-03-01 (×2): 2 via ORAL
  Filled 2019-02-27 (×3): qty 2

## 2019-02-27 MED ORDER — ONDANSETRON HCL 4 MG PO TABS: 4.0000 mg | ORAL_TABLET | ORAL | Status: DC | PRN

## 2019-02-27 MED ORDER — MAGNESIUM HYDROXIDE 400 MG/5ML PO SUSP: 30.0000 mL | ORAL | Status: DC | PRN

## 2019-02-27 MED ORDER — PRENATAL MULTIVITAMIN CH
1.0000 | ORAL_TABLET | Freq: Every day | ORAL | Status: DC
  Administered 2019-02-28 – 2019-03-01 (×2): 1 via ORAL
  Filled 2019-02-27 (×2): qty 1

## 2019-02-27 MED ORDER — COCONUT OIL OIL: 1.0000 "application " | TOPICAL_OIL | Status: DC | PRN

## 2019-02-27 MED ORDER — MEASLES, MUMPS & RUBELLA VAC IJ SOLR
0.5000 mL | Freq: Once | INTRAMUSCULAR | Status: DC
  Filled 2019-02-27: qty 0.5

## 2019-02-27 MED ORDER — ONDANSETRON HCL 4 MG/2ML IJ SOLN: 4.0000 mg | INTRAMUSCULAR | Status: DC | PRN

## 2019-02-27 MED ORDER — ENOXAPARIN SODIUM 40 MG/0.4ML ~~LOC~~ SOLN
40.0000 mg | SUBCUTANEOUS | Status: DC
  Administered 2019-02-27 – 2019-02-28 (×2): 40 mg via SUBCUTANEOUS
  Filled 2019-02-27 (×2): qty 0.4

## 2019-02-27 MED ORDER — FENTANYL CITRATE (PF) 100 MCG/2ML IJ SOLN: 100.0000 ug | Freq: Once | INTRAMUSCULAR | Status: AC

## 2019-02-27 MED ORDER — OXYCODONE HCL 5 MG PO TABS: 5.0000 mg | ORAL_TABLET | ORAL | Status: DC | PRN

## 2019-02-27 MED ORDER — WITCH HAZEL-GLYCERIN EX PADS
1.0000 "application " | MEDICATED_PAD | CUTANEOUS | Status: DC | PRN
  Administered 2019-03-01: 1 via TOPICAL
  Filled 2019-02-27 (×2): qty 100

## 2019-02-27 NOTE — Progress Notes (Addendum)
Pt feels anxious and her face is tingling and it is hard to breath. Epidural level check within normal limits T8/10 checked with temperature. VSS. Warm blanket, SCDs and support given. Pt agrees it is her anxiety

## 2019-02-27 NOTE — Progress Notes (Signed)
9604 SVE 5/90/-1  Labor Progress Note  [name] is a 24 y.o. G1P0000 at [redacted]w[redacted]d by LMP admitted for induction of labor due to chronic hypertension.  Subjective: Pt is laying in bed. Epidural in placed. Pt reports feeling more comfortable now with epidural. Better able to tolerate exams. Pt voiced feeling disappointed in the process and timing of the induction, worried that it was taking too long. Reassurances given.  Objective: BP 135/77   Pulse (!) 108   Temp 98.3 F (36.8 C) (Oral)   Resp 16   Ht 5\' 9"  (1.753 m)   Wt 117.9 kg   LMP 06/12/2018   SpO2 93%   BMI 38.40 kg/m   Fetal Assessment: FHT:  FHR: 150 bpm, variability: moderate,  accelerations:  Present,  decelerations:  Absent Category/reactivity:  Category I UC:  Regular Q1-2 min SVE:    Dilation: 5cm  Effacement: 90%  Station:  -1  Consistency: soft  Position: anterior  Membrane status: Intact Amniotic color: n/a  Assessment / Plan: Induction of labor due to gestational hypertension, Progressing on Pitocin  Attempted to AROM, unsuccessful, pt tolerated well.  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

## 2019-02-27 NOTE — Progress Notes (Signed)
[name] is a 24 y.o. G1P0000 at [redacted]w[redacted]d by day#2 induction for gestational hypertension IUPC placed  Subjective:  Cle adequately relieving pain .  Pitocin now turned down to 10  Noted reviewed by Dr Dalbert Garnet . ? AROM   Objective: BP 135/77   Pulse (!) 108   Temp 98.3 F (36.8 C) (Oral)   Resp 16   Ht 5\' 9"  (1.753 m)   Wt 117.9 kg   LMP 06/12/2018   SpO2 93%   BMI 38.40 kg/m  I/O last 3 completed shifts: In: 1029.1 [I.V.:1029.1] Out: 200 [Urine:200] Total I/O In: 1896.2 [P.O.:100; I.V.:1796.2] Out: 300 [Urine:300]  FHT:  FHR: 150 bpm, variability: moderate,  accelerations:  Present,  decelerations:  Absent UC:   regular, every 1-2 minutes SVE:   Dilation: 6.5 Effacement (%): 90 Station: -1 Exam by:: B.Beasley, MD  Labs: Lab Results  Component Value Date   WBC 8.8 02/26/2019   HGB 9.9 (L) 02/26/2019   HCT 30.9 (L) 02/26/2019   MCV 76.7 (L) 02/26/2019   PLT 277 02/26/2019    Assessment / Plan: Spontaneous labor, progressing normally Adjust Pitocin to keep prevent hyperstimulation  Labor: Progressing normally Preeclampsia:  labs stable Fetal Wellbeing:  Category I Pain Control:  Epidural I/D:  n/a Anticipated MOD:  NSVD  04/26/2019 Candelario Steppe 02/27/2019, 9:22 AM

## 2019-02-27 NOTE — Discharge Summary (Signed)
Obstetrical Discharge Summary  Patient Name: [name] DOB: [birth year] MRN: [id number]  Date of Admission: 02/26/2019 Date of Delivery: 02/27/2019 Delivered by: Huel Cote MD Date of Discharge: 03/01/2019 Primary OB: [place]  [id number] last menstrual period was 06/12/2018. 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 NSVD (normal spontaneous vaginal delivery) 02/28/2019  . Acute blood loss anemia 02/28/2019  . Postpartum hemorrhage 02/27/2019  . Chronic hypertension affecting pregnancy 02/26/2019  . Encounter for antenatal screening for chromosomal anomalies   . Family history of autism   . Hyperemesis gravidarum 09/06/2018  . UTI (urinary tract infection) 03/20/2017  . Anxiety and depression 06/16/2016  . Hypertension 02/01/2012  . Insulin resistance 02/01/2012  . Obesity 02/01/2012  . Pancreatitis, acute 02/01/2012  . 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 Esophageal reflux   . Proctalgia     Augmentation: Pitocin and cytotec Complications: PPH measured EBL 1250 cc Intrapartum complications/course: See Dr. Tonette Bihari documentation.  Delivered By: Huel Cote MD Delivery Type: spontaneous vaginal delivery Anesthesia: epidural Placenta: spontaneous Laceration: second  With repair  Episiotomy: none Newborn Data: Live born female  Birth Weight:  2950g 6lb 8.1oz APGAR: 40, 9  Newborn Delivery   Birth date/time: 02/27/2019 11:02:00 Delivery type: Vaginal, Spontaneous      Postpartum Procedures: IV venofer x1  Post partum course:  Patient had an uncomplicated postpartum course.  By time of discharge on PPD#2, her pain was controlled on oral pain  medications; she had appropriate lochia and was ambulating, voiding without difficulty and tolerating regular diet.  She was deemed stable for discharge to home.      Discharge Physical Exam:  BP 121/60 (BP Location: Left Arm)   Pulse (!) 102   Temp 98 F (36.7 C)   Resp 18   Ht 5\' 9"  (1.753 m)   Wt 117.9 kg   LMP 06/12/2018   SpO2 99%   Breastfeeding Unknown   BMI 38.40 kg/m   General: NAD CV: RRR Pulm: CTABL, nl effort ABD: s/nd/nt, fundus firm and below the umbilicus Lochia: moderate DVT Evaluation: LE non-ttp, no evidence of DVT on exam.  Hemoglobin  Date Value Ref Range Status  03/01/2019 7.9 (L) 12.0 - 15.0 g/dL Final   HGB  Date Value Ref Range Status  01/08/2014 12.7 12.0 - 16.0 g/dL Final   HCT  Date Value Ref Range Status  03/01/2019 25.6 (L) 36.0 - 46.0 % Final  01/08/2014 38.4 35.0 - 47.0 % Final     Disposition: stable, discharge to home. Baby Feeding:bottle Baby Disposition: home with mom  Rh Immune globulin given: n/a Rubella vaccine given: n/a Tdap vaccine given in AP or PP setting: 12/27/2018 Flu vaccine given in AP or PP setting: 11/29/2018  Contraception: condoms, withdrawal  Prenatal Labs:  Blood type/Rh A+  Antibody screen neg  Rubella Immune  Varicella Immune  RPR NR  HBsAg Neg  HIV NR  GC neg  Chlamydia neg  Genetic screening negative  1 hour GTT 97  3 hour GTT n/a  GBS negative    Plan:  TEHILA SOKOLOW was discharged to home in good condition. Follow-up appointment with delivering provider in 6 weeks. Remain on daily Lovenox 40 mg SQ q d for  6 weeks  Need BP 1 week from discharge   Discharge Medications: Allergies as of 03/01/2019      Reactions   Estrogens Other (See Comments)   Hx clotting disorder      Medication List    STOP taking these medications   aspirin 81 MG chewable tablet     TAKE these medications   acetaminophen 500 MG tablet Commonly known as: Tylenol Take 2 tablets (1,000 mg total) by mouth  every 6 (six) hours as needed for mild pain or moderate pain.   citalopram 20 MG tablet Commonly known as: CELEXA Take 20 mg by mouth daily.   enoxaparin 40 MG/0.4ML injection Commonly known as: LOVENOX Inject 0.4 mLs (40 mg total) into the skin at bedtime.   famotidine 20 MG tablet Commonly known as: PEPCID Take 20 mg by mouth 2 (two) times daily.   ferrous sulfate 325 (65 FE) MG tablet Take 1 tablet (325 mg total) by mouth 2 (two) times daily with a meal.   ibuprofen 600 MG tablet Commonly known as: ADVIL Take 1 tablet (600 mg total) by mouth every 6 (six) hours as needed for mild pain, moderate pain or cramping.   prenatal multivitamin Tabs tablet Take 1 tablet by mouth daily at 12 noon.         Signed: [name], CNM 03/01/2019 8:20 AM

## 2019-02-27 NOTE — Progress Notes (Signed)
[name] is a 24 y.o. G1P0000 at [redacted]w[redacted]d  -iol for cHTN - moderately comfortable with epidural - feeling lots of pain in lower back   Objective: BP 135/77   Pulse (!) 108   Temp 98.3 F (36.8 C) (Oral)   Resp 16   Ht 5\' 9"  (1.753 m)   Wt 117.9 kg   LMP 06/12/2018   SpO2 93%   BMI 38.40 kg/m  I/O last 3 completed shifts: In: 1029.1 [I.V.:1029.1] Out: 200 [Urine:200] Total I/O In: 1896.2 [P.O.:100; I.V.:1796.2] Out: 300 [Urine:300]  FHT:  FHR: 150 bpm, variability: moderate,  accelerations:  Present,  decelerations:  Absent UC:   regular, every 2-3 minutes SVE:   Dilation: 5 Effacement (%): 80, 90 Station: -1 Exam by:: K.Maldonado,RN   On pitocin, 57mu/min  Labs: Lab Results  Component Value Date   WBC 8.8 02/26/2019   HGB 9.9 (L) 02/26/2019   HCT 30.9 (L) 02/26/2019   MCV 76.7 (L) 02/26/2019   PLT 277 02/26/2019    Assessment / Plan: IOL for cHTN, BP in mild range, PreE labs wnl on admission Cervix now thin and fetal head well-applied  - IUPC placed - Attempted AROM, but no membranes palpable. Bloody show, no clear fluid - Continue labor  Anticipated MOD:  NSVD  04/26/2019 02/27/2019, 9:10 AM

## 2019-02-27 NOTE — Progress Notes (Signed)
RN spoke with T.Schermerhorn, MD about Labetolol 20mg  IV one time order. MD clarified that the order should actually be Labetolol 20mg  PRN with order parameters SBP >160 and/or DBP >110. No order correction made at this time.

## 2019-02-28 DIAGNOSIS — D62 Acute posthemorrhagic anemia: Secondary | ICD-10-CM

## 2019-02-28 LAB — CBC
HCT: 22.5 % — ABNORMAL LOW (ref 36.0–46.0)
Hemoglobin: 7.2 g/dL — ABNORMAL LOW (ref 12.0–15.0)
MCH: 25 pg — ABNORMAL LOW (ref 26.0–34.0)
MCHC: 32 g/dL (ref 30.0–36.0)
MCV: 78.1 fL — ABNORMAL LOW (ref 80.0–100.0)
Platelets: 226 10*3/uL (ref 150–400)
RBC: 2.88 MIL/uL — ABNORMAL LOW (ref 3.87–5.11)
RDW: 14 % (ref 11.5–15.5)
WBC: 8.8 10*3/uL (ref 4.0–10.5)
nRBC: 0 % (ref 0.0–0.2)

## 2019-02-28 MED ORDER — DOCUSATE SODIUM 100 MG PO CAPS
100.0000 mg | ORAL_CAPSULE | Freq: Two times a day (BID) | ORAL | Status: DC
  Administered 2019-02-28 – 2019-03-01 (×3): 100 mg via ORAL
  Filled 2019-02-28 (×3): qty 1

## 2019-02-28 MED ORDER — SODIUM CHLORIDE 0.9 % IV SOLN
300.0000 mg | Freq: Once | INTRAVENOUS | Status: AC
  Administered 2019-02-28: 23:00:00 300 mg via INTRAVENOUS
  Filled 2019-02-28: qty 15

## 2019-02-28 NOTE — Progress Notes (Signed)
Discussed benefits of IV iron vs PO at lunchtime.  Patient has decided to have IV iron tonight.   Already on Vit C TID  300mg  IV venofer now.   Likely D/C in AM.    ----- , MD, FACOG Attending Obstetrician and Gynecologist [facility], Department of OB/GYN [facility]

## 2019-02-28 NOTE — Progress Notes (Signed)
Post Partum Day 1  Subjective: Doing well, no concerns. Ambulating with occasional SOB, pain managed with PO meds, tolerating regular diet, and voiding without difficulty.   No fever/chills, chest pain, nausea/vomiting, or leg pain. No nipple or breast pain. No headache, visual changes, or RUQ/epigastric pain.  Objective: BP 125/88 (BP Location: Left Arm)   Pulse (!) 111   Temp 98.2 F (36.8 C) (Oral)   Resp 18   Ht 5\' 9"  (1.753 m)   Wt 117.9 kg   LMP 06/12/2018   SpO2 100%   Breastfeeding Unknown   BMI 38.40 kg/m    Physical Exam:  General: alert and cooperative Breasts: soft/nontender CV: RRR Pulm: nl effort Abdomen: soft, non-tender Uterine Fundus: firm Incision: n/a Perineum: repair well approximated Lochia: appropriate DVT Evaluation: No evidence of DVT seen on physical exam.  Recent Labs    02/27/19 1505 02/28/19 0528  HGB 8.5* 7.2*  HCT 26.9* 22.5*  WBC 11.6* 8.8  PLT 262 226    Assessment/Plan: 24 y.o. G1P1001 postpartum day # 1  -Continue routine postpartum care -Lactation consult PRN for pumping and bottle feeding  -Acute blood loss anemia - symptomatic; Offered her Fe transfusion - pt declined.  Start PO ferrous sulfate TID with stool softeners  -Immunization status:  all immunizations up to date  Disposition: Continue inpatient postpartum care    LOS: 2 days   [name], CNM 02/28/2019, 12:40 PM

## 2019-02-28 NOTE — Anesthesia Postprocedure Evaluation (Signed)
Anesthesia Post Note  Patient: [name]  Procedure(s) Performed: AN AD HOC LABOR EPIDURAL  Patient location during evaluation: Mother Baby Anesthesia Type: Epidural Level of consciousness: awake and alert and oriented Pain management: pain level controlled Vital Signs Assessment: post-procedure vital signs reviewed and stable Respiratory status: spontaneous breathing, nonlabored ventilation and respiratory function stable Cardiovascular status: stable Postop Assessment: no headache, no backache, epidural receding and able to ambulate Anesthetic complications: no     Last Vitals:  Vitals:   02/27/19 2317 02/28/19 0319  BP: (!) 146/92 (!) 142/89  Pulse: (!) 116 (!) 114  Resp: 20 18  Temp: 36.7 C 36.9 C  SpO2: 99% 100%    Last Pain:  Vitals:   02/28/19 0319  TempSrc: Oral  PainSc:                  Rosanne Gutting

## 2019-03-01 LAB — CBC
HCT: 25.6 % — ABNORMAL LOW (ref 36.0–46.0)
Hemoglobin: 7.9 g/dL — ABNORMAL LOW (ref 12.0–15.0)
MCH: 24.4 pg — ABNORMAL LOW (ref 26.0–34.0)
MCHC: 30.9 g/dL (ref 30.0–36.0)
MCV: 79 fL — ABNORMAL LOW (ref 80.0–100.0)
Platelets: 294 10*3/uL (ref 150–400)
RBC: 3.24 MIL/uL — ABNORMAL LOW (ref 3.87–5.11)
RDW: 14.1 % (ref 11.5–15.5)
WBC: 10.2 10*3/uL (ref 4.0–10.5)
nRBC: 0 % (ref 0.0–0.2)

## 2019-03-01 MED ORDER — IBUPROFEN 600 MG PO TABS: 600.0000 mg | ORAL_TABLET | Freq: Four times a day (QID) | ORAL | 0 refills | Status: DC | PRN

## 2019-03-01 MED ORDER — FERROUS SULFATE 325 (65 FE) MG PO TABS: 325.0000 mg | ORAL_TABLET | Freq: Two times a day (BID) | ORAL | 1 refills | Status: DC

## 2019-03-01 MED ORDER — ENOXAPARIN SODIUM 40 MG/0.4ML ~~LOC~~ SOLN: 40.0000 mg | Freq: Every day | SUBCUTANEOUS | 0 refills | Status: DC

## 2019-03-01 MED ORDER — ACETAMINOPHEN 500 MG PO TABS: 1000.0000 mg | ORAL_TABLET | Freq: Four times a day (QID) | ORAL | Status: DC | PRN

## 2019-03-01 NOTE — Progress Notes (Signed)
Pt discharged with infant. Discharge instructions, prescriptions, and follow up appointments given to and reviewed with patient. Pt verbalized understanding. Escorted out by auxillary.  

## 2019-03-01 NOTE — Discharge Instructions (Signed)
Postpartum Baby Blues The postpartum period begins right after the birth of a baby. During this time, there is often a lot of joy and excitement. It is also a time of many changes in the life of the parents. No matter how many times a mother gives birth, each child brings new challenges to the family, including different ways of relating to one another. It is common to have feelings of excitement along with confusing changes in moods, emotions, and thoughts. You may feel happy one minute and sad or stressed the next. These feelings of sadness usually happen in the period right after you have your baby, and they go away within a week or two. This is called the "baby blues." What are the causes? There is no known cause of baby blues. It is likely caused by a combination of factors. However, changes in hormone levels after childbirth are believed to trigger some of the symptoms. Other factors that can play a role in these mood changes include:  Lack of sleep.  Stressful life events, such as poverty, caring for a loved one, or death of a loved one.  Genetics. What are the signs or symptoms? Symptoms of this condition include:  Brief changes in mood, such as going from extreme happiness to sadness.  Decreased concentration.  Difficulty sleeping.  Crying spells and tearfulness.  Loss of appetite.  Irritability.  Anxiety. If the symptoms of baby blues last for more than 2 weeks or become more severe, you may have postpartum depression. How is this diagnosed? This condition is diagnosed based on an evaluation of your symptoms. There are no medical or lab tests that lead to a diagnosis, but there are various questionnaires that a health care provider may use to identify women with the baby blues or postpartum depression. How is this treated? Treatment is not needed for this condition. The baby blues usually go away on their own in 1-2 weeks. Social support is often all that is needed. You will  be encouraged to get adequate sleep and rest. Follow these instructions at home: Lifestyle      Get as much rest as you can. Take a nap when the baby sleeps.  Exercise regularly as told by your health care provider. Some women find yoga and walking to be helpful.  Eat a balanced and nourishing diet. This includes plenty of fruits and vegetables, whole grains, and lean proteins.  Do little things that you enjoy. Have a cup of tea, take a bubble bath, read your favorite magazine, or listen to your favorite music.  Avoid alcohol.  Ask for help with household chores, cooking, grocery shopping, or running errands. Do not try to do everything yourself. Consider hiring a postpartum doula to help. This is a professional who specializes in providing support to new mothers.  Try not to make any major life changes during pregnancy or right after giving birth. This can add stress. General instructions  Talk to people close to you about how you are feeling. Get support from your partner, family members, friends, or other new moms. You may want to join a support group.  Find ways to cope with stress. This may include: ? Writing your thoughts and feelings in a journal. ? Spending time outside. ? Spending time with people who make you laugh.  Try to stay positive in how you think. Think about the things you are grateful for.  Take over-the-counter and prescription medicines only as told by your health care provider.    Let your health care provider know if you have any concerns.  Keep all postpartum visits as told by your health care provider. This is important. Contact a health care provider if:  Your baby blues do not go away after 2 weeks. Get help right away if:  You have thoughts of taking your own life (suicidal thoughts).  You think you may harm the baby or other people.  You see or hear things that are not there (hallucinations). Summary  After giving birth, you may feel happy  one minute and sad or stressed the next. Feelings of sadness that happen right after the baby is born and go away after a week or two are called the "baby blues."  You can manage the baby blues by getting enough rest, eating a healthy diet, exercising, spending time with supportive people, and finding ways to cope with stress.  If feelings of sadness and stress last longer than 2 weeks or get in the way of caring for your baby, talk to your health care provider. This may mean you have postpartum depression. This information is not intended to replace advice given to you by your health care provider. Make sure you discuss any questions you have with your health care provider. Document Revised: 05/04/2018 Document Reviewed: 03/08/2016 Elsevier Patient Education  [employer]. After Your Delivery Discharge Instructions   Postpartum: Care Instructions  After childbirth (postpartum period), your body goes through many changes. Some of these changes happen over several weeks. In the hours after delivery, your body will begin to recover from childbirth while it prepares to breastfeed your newborn. You may feel emotional during this time. Your hormones can shift your mood without warning for no clear reason.  In the first couple of weeks after childbirth, many women have emotions that change from happy to sad. You may find it hard to sleep. You may cry a lot. This is called the "baby blues." These overwhelming emotions often go away within a couple of days or weeks. But it's important to discuss your feelings with your doctor.  You should call your care provider if you have unrelieved feelings of:  Inability to cope  Sadness  Anxiety  Lack of interest in baby  Insomnia  Crying  It is easy to get too tired and overwhelmed during the first weeks after childbirth. Don't try to do too much. Get rest whenever you can, accept help from others, and eat well and drink plenty of fluids.  About 4  to 6 weeks after your baby's birth, you will have a follow-up visit with your care provider. This visit is your time to talk to your provider about anything you are concerned or curious about.  Follow-up care is a key part of your treatment and safety. Be sure to make and go to all appointments, and call your doctor if you are having problems. It's also a good idea to know your test results and keep a list of the medicines you take.  How can you care for yourself at home?  Sleep or rest when your baby sleeps.  Get help with household chores from family or friends, if you can. Do not try to do it all yourself.  If you have hemorrhoids or swelling or pain around the opening of your vagina, try using cold and heat. You can put ice or a cold pack on the area for 10 to 20 minutes at a time. Put a thin cloth between the ice and your  skin. Also try sitting in a few inches of warm water (sitz bath) 3 times a day and after bowel movements.  Take pain medicines exactly as directed.  If the provider gave you a prescription medicine for pain, take it as prescribed.  If you do not have a prescription and need something over the counter, you can take:  Ibuprofen (Motrin, Advil) up to 600mg  every 6 hours as needed for pain  Acetaminophen (Tylenol) up to 650mg  every 4 hours as needed for pain  Some people find it helpful to alternate between these two medications.   No driving for 1-2 weeks or while taking pain medications.   Eat more fiber to avoid constipation. Include foods such as whole-grain breads and cereals, raw vegetables, raw and dried fruits, and beans.  Drink plenty of fluids, enough so that your urine is light yellow or clear like water. If you have kidney, heart, or liver disease and have to limit fluids, talk with your doctor before you increase the amount of fluids you drink.  Do not put anything in the vagina for 6 weeks. This means no sex, no tampons, no douching, and no enemas.  If  you have stitches, keep the area clean by pouring or spraying warm water over the area outside your vagina and anus after you use the toilet.  No strenuous activity or heavy lifting for 6 weeks   No tub baths; showers only  Continue prenatal vitamin and iron.  If breastfeeding:  Increase calories and fluids while breastfeeding.  You may have a slight fever when your milk comes in, but it should go away on its own. If it does not, and rises above 101.0 please call the doctor.  For breastfeeding concerns, the lactation consultant can be reached at [phone].  For concerns about your baby, please call your pediatrician.   Keep a list of questions to bring to your postpartum visit. Your questions might be about:  Changes in your breasts, such as lumps or soreness.  When to expect your menstrual period to start again.  What form of birth control is best for you.  Weight you have put on during the pregnancy.  Exercise options.  What foods and drinks are best for you, especially if you are breastfeeding.  Problems you might be having with breastfeeding.  When you can have sex. Some women may want to talk about lubricants for the vagina.  Any feelings of sadness or restlessness that you are having.   When should you call for help?  Call 911 anytime you think you may need emergency care. For example, call if:  You have thoughts of harming yourself, your baby, or another person.  You passed out (lost consciousness).  Call the office at [phone] or seek immediate medical care if:  If you have heavy bleeding such that you are soaking 1 pad in an hour for 2 hours  You are dizzy or lightheaded, or you feel like you may faint.  You have a fever; a temperature of 101.0 F or greater  Chills  Difficulty urinating  Headache unrelieved by "pain meds"   Visual changes  Pain in the right side of your belly near your ribs  Breasts reddened, hard, hot to the touch or  any other breast concerns  Nipple discharge which is foul-smelling or contains pus   New pain unrelieved with recommended over-the-counter dosages  Difficulty breathing with or without chest pain   New leg pain, swelling, or redness, especially if  it is only on one leg  Any other concerns  Watch closely for changes in your health, and be sure to contact your provider if:  You have new or worse vaginal discharge.  You feel sad or depressed.  You are having problems with your breasts or breastfeeding.

## 2019-03-07 DIAGNOSIS — O135 Gestational [pregnancy-induced] hypertension without significant proteinuria, complicating the puerperium: Secondary | ICD-10-CM | POA: Diagnosis not present

## 2019-03-07 DIAGNOSIS — Z8759 Personal history of other complications of pregnancy, childbirth and the puerperium: Secondary | ICD-10-CM | POA: Diagnosis not present

## 2019-04-12 DIAGNOSIS — L03032 Cellulitis of left toe: Secondary | ICD-10-CM | POA: Diagnosis not present

## 2019-04-29 DIAGNOSIS — Z3009 Encounter for other general counseling and advice on contraception: Secondary | ICD-10-CM | POA: Diagnosis not present

## 2019-04-29 DIAGNOSIS — F53 Postpartum depression: Secondary | ICD-10-CM | POA: Diagnosis not present

## 2019-04-29 DIAGNOSIS — Z124 Encounter for screening for malignant neoplasm of cervix: Secondary | ICD-10-CM | POA: Diagnosis not present

## 2019-04-29 DIAGNOSIS — Z1332 Encounter for screening for maternal depression: Secondary | ICD-10-CM | POA: Diagnosis not present

## 2019-06-11 DIAGNOSIS — F53 Postpartum depression: Secondary | ICD-10-CM | POA: Diagnosis not present

## 2019-06-11 DIAGNOSIS — Z1332 Encounter for screening for maternal depression: Secondary | ICD-10-CM | POA: Diagnosis not present

## 2019-07-25 DIAGNOSIS — Z419 Encounter for procedure for purposes other than remedying health state, unspecified: Secondary | ICD-10-CM | POA: Diagnosis not present

## 2019-08-14 DIAGNOSIS — Z3009 Encounter for other general counseling and advice on contraception: Secondary | ICD-10-CM | POA: Diagnosis not present

## 2019-08-14 DIAGNOSIS — F419 Anxiety disorder, unspecified: Secondary | ICD-10-CM | POA: Diagnosis not present

## 2019-08-14 DIAGNOSIS — F329 Major depressive disorder, single episode, unspecified: Secondary | ICD-10-CM | POA: Diagnosis not present

## 2019-08-25 DIAGNOSIS — Z419 Encounter for procedure for purposes other than remedying health state, unspecified: Secondary | ICD-10-CM | POA: Diagnosis not present

## 2019-09-25 DIAGNOSIS — Z419 Encounter for procedure for purposes other than remedying health state, unspecified: Secondary | ICD-10-CM | POA: Diagnosis not present

## 2019-10-25 DIAGNOSIS — Z419 Encounter for procedure for purposes other than remedying health state, unspecified: Secondary | ICD-10-CM | POA: Diagnosis not present

## 2019-11-14 DIAGNOSIS — F32A Depression, unspecified: Secondary | ICD-10-CM | POA: Diagnosis not present

## 2019-11-14 DIAGNOSIS — F419 Anxiety disorder, unspecified: Secondary | ICD-10-CM | POA: Diagnosis not present

## 2019-11-25 DIAGNOSIS — Z419 Encounter for procedure for purposes other than remedying health state, unspecified: Secondary | ICD-10-CM | POA: Diagnosis not present

## 2019-11-28 DIAGNOSIS — H5213 Myopia, bilateral: Secondary | ICD-10-CM | POA: Diagnosis not present

## 2019-12-17 DIAGNOSIS — H52223 Regular astigmatism, bilateral: Secondary | ICD-10-CM | POA: Diagnosis not present

## 2019-12-17 DIAGNOSIS — H5213 Myopia, bilateral: Secondary | ICD-10-CM | POA: Diagnosis not present

## 2019-12-25 DIAGNOSIS — Z419 Encounter for procedure for purposes other than remedying health state, unspecified: Secondary | ICD-10-CM | POA: Diagnosis not present

## 2020-01-25 DIAGNOSIS — Z419 Encounter for procedure for purposes other than remedying health state, unspecified: Secondary | ICD-10-CM | POA: Diagnosis not present

## 2020-02-25 DIAGNOSIS — Z419 Encounter for procedure for purposes other than remedying health state, unspecified: Secondary | ICD-10-CM | POA: Diagnosis not present

## 2020-03-24 DIAGNOSIS — Z419 Encounter for procedure for purposes other than remedying health state, unspecified: Secondary | ICD-10-CM | POA: Diagnosis not present

## 2020-04-24 DIAGNOSIS — Z419 Encounter for procedure for purposes other than remedying health state, unspecified: Secondary | ICD-10-CM | POA: Diagnosis not present

## 2020-05-24 DIAGNOSIS — Z419 Encounter for procedure for purposes other than remedying health state, unspecified: Secondary | ICD-10-CM | POA: Diagnosis not present

## 2020-06-24 DIAGNOSIS — Z419 Encounter for procedure for purposes other than remedying health state, unspecified: Secondary | ICD-10-CM | POA: Diagnosis not present

## 2020-07-24 DIAGNOSIS — Z419 Encounter for procedure for purposes other than remedying health state, unspecified: Secondary | ICD-10-CM | POA: Diagnosis not present

## 2020-08-24 DIAGNOSIS — Z419 Encounter for procedure for purposes other than remedying health state, unspecified: Secondary | ICD-10-CM | POA: Diagnosis not present

## 2020-09-24 DIAGNOSIS — Z419 Encounter for procedure for purposes other than remedying health state, unspecified: Secondary | ICD-10-CM | POA: Diagnosis not present

## 2020-10-24 DIAGNOSIS — Z419 Encounter for procedure for purposes other than remedying health state, unspecified: Secondary | ICD-10-CM | POA: Diagnosis not present

## 2020-11-12 IMAGING — CT CT ANGIO CHEST
2 of 6 series · 18 of 46 positions shown · IV contrast (APPLIED)
Comparison: None.

CLINICAL DATA: Left arm and shoulder pain with palpitations at 28
weeks gestation.

EXAM:
CT ANGIOGRAPHY CHEST WITH CONTRAST
TECHNIQUE: Multidetector CT imaging of the chest was performed using the
standard protocol during bolus administration of intravenous
contrast. Multiplanar CT image reconstructions and MIPs were
obtained to evaluate the vascular anatomy.
CONTRAST:  75mL OMNIPAQUE IOHEXOL 350 MG/ML SOLN

[Series 5: thins · axial · 0.79mm/px · z∈[-664,-439]mm · 16 of 247 slices shown]
[im 11/247  lung]
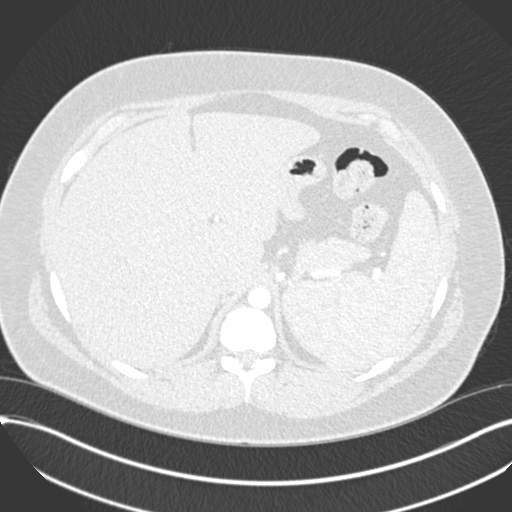
[im 33/247  soft-tissue]
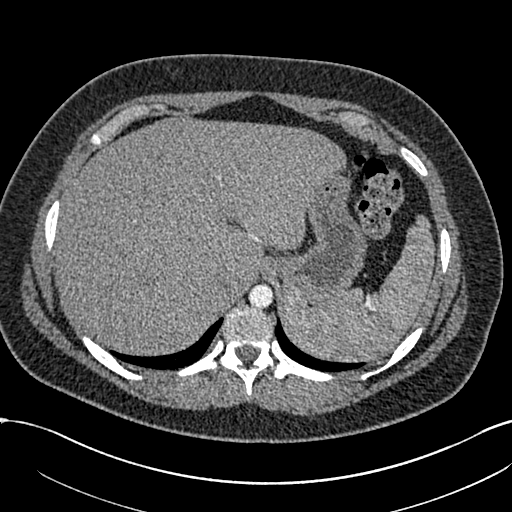
[im 43/247  lung]
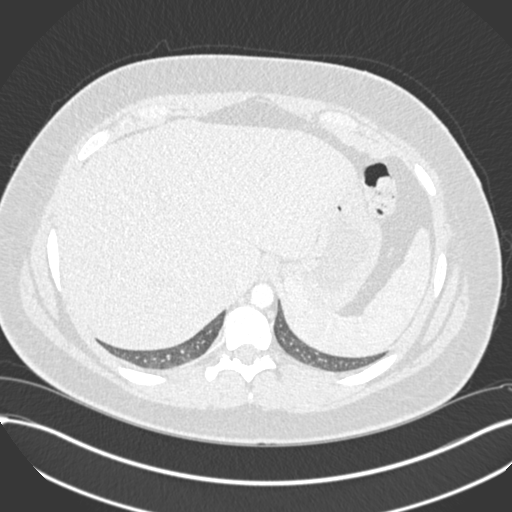
[im 54/247  soft-tissue]
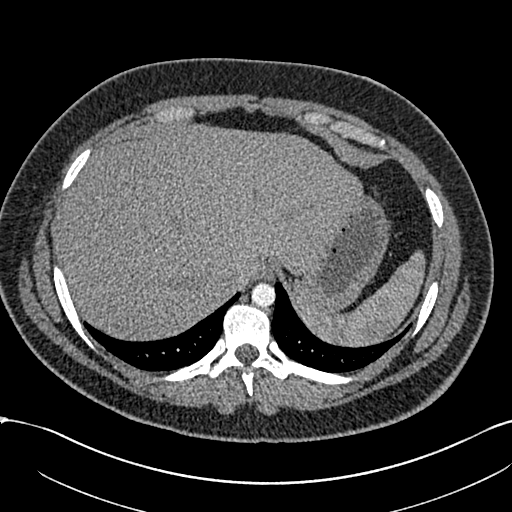
[im 75/247  lung]
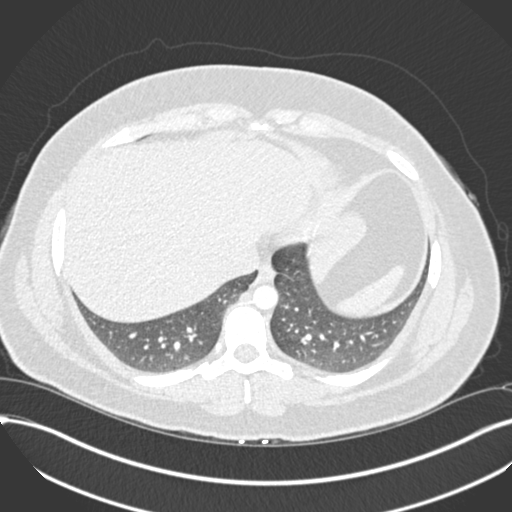
[im 86/247  soft-tissue]
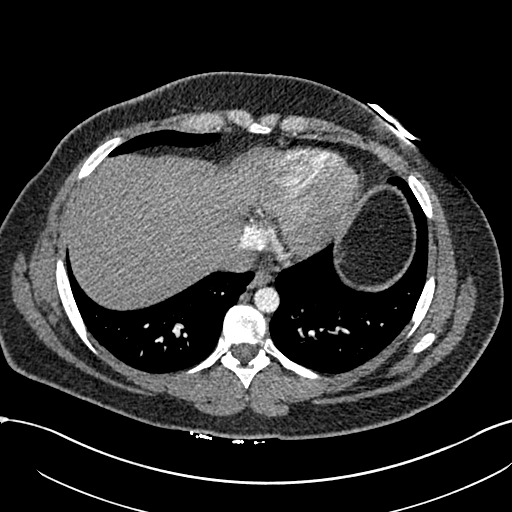
[im 97/247  lung]
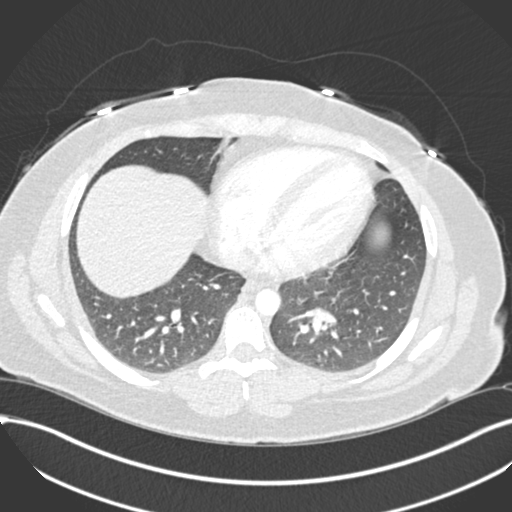
[im 118/247  soft-tissue]
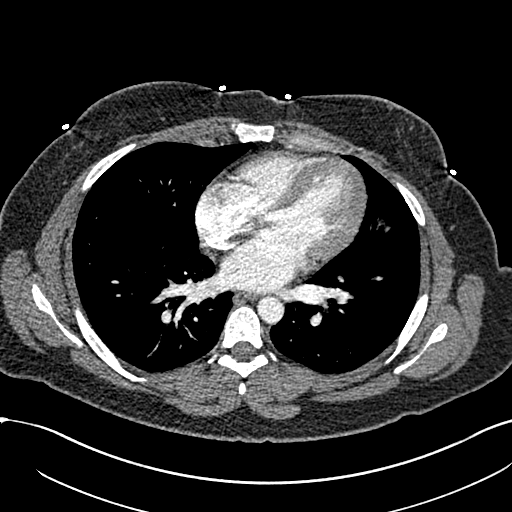
[im 129/247  lung]
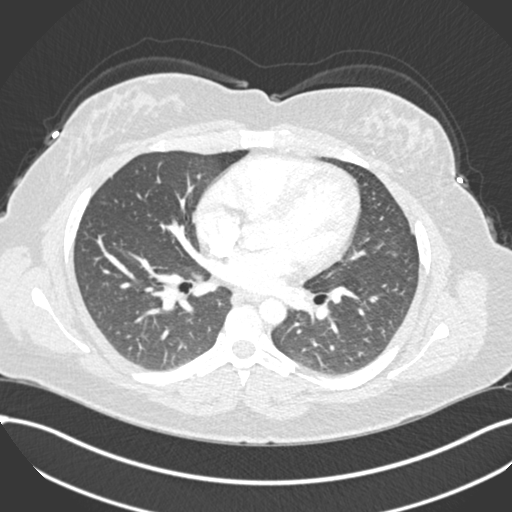
[im 150/247  soft-tissue]
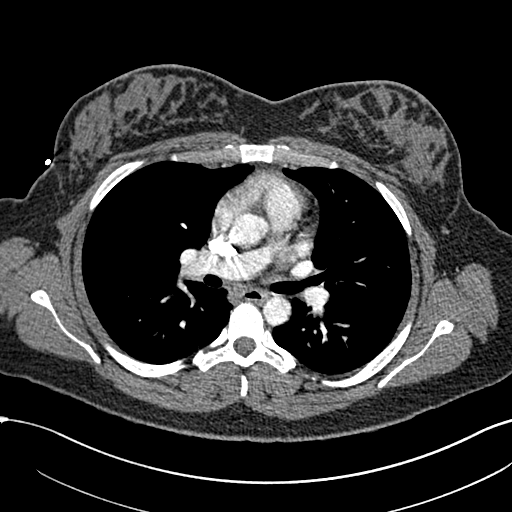
[im 161/247  lung]
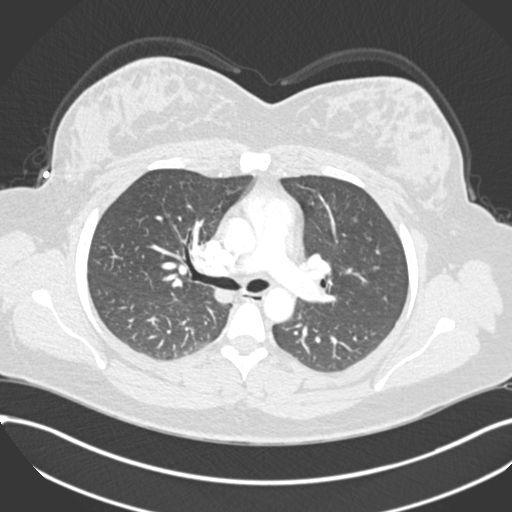
[im 172/247  soft-tissue]
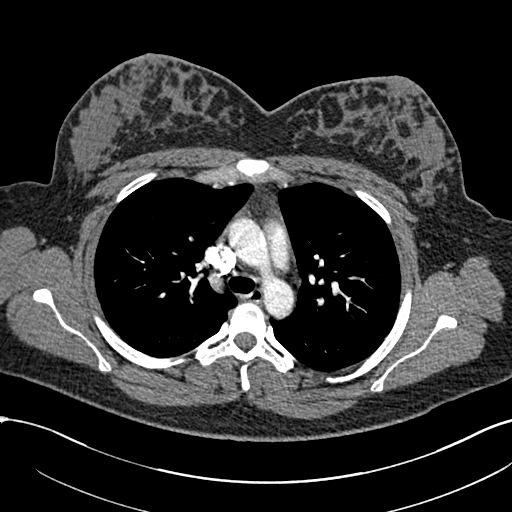
[im 193/247  lung]
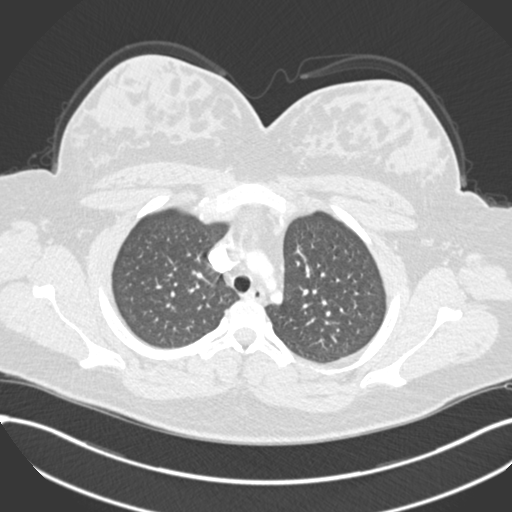
[im 204/247  soft-tissue]
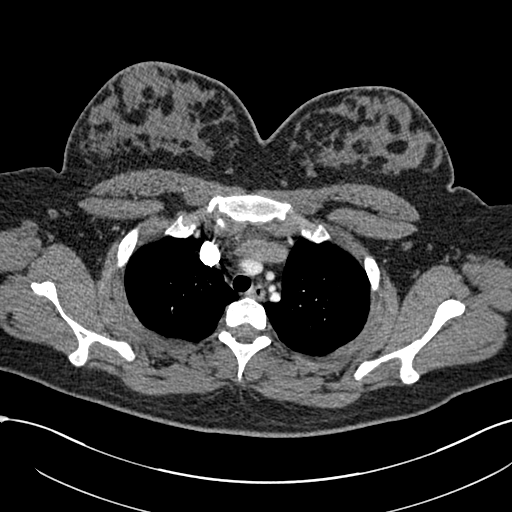
[im 214/247  lung]
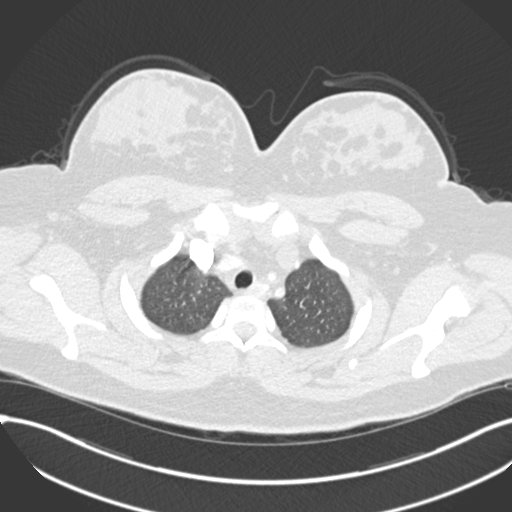
[im 236/247  soft-tissue]
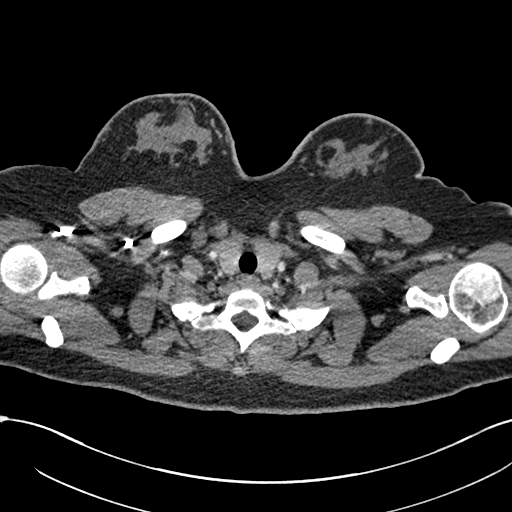

[Series 7: coronal mpr · coronal · 0.49mm/px · 2 of 87 slices shown]
[im 29/87  soft-tissue]
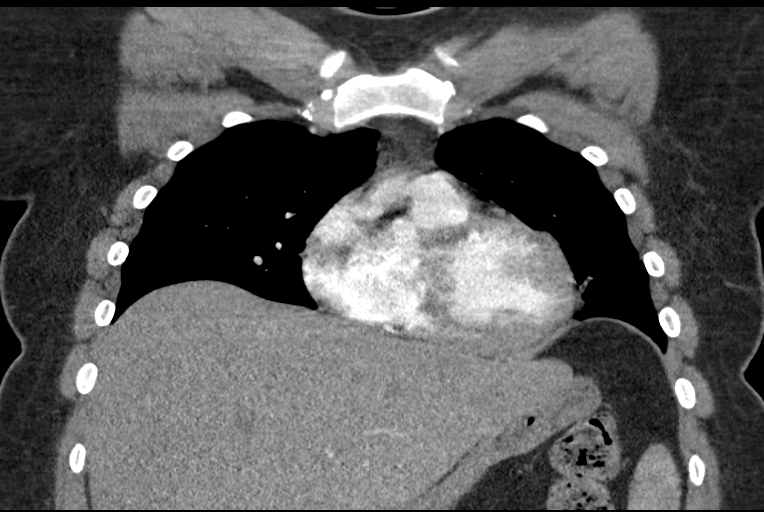
[im 58/87  soft-tissue]
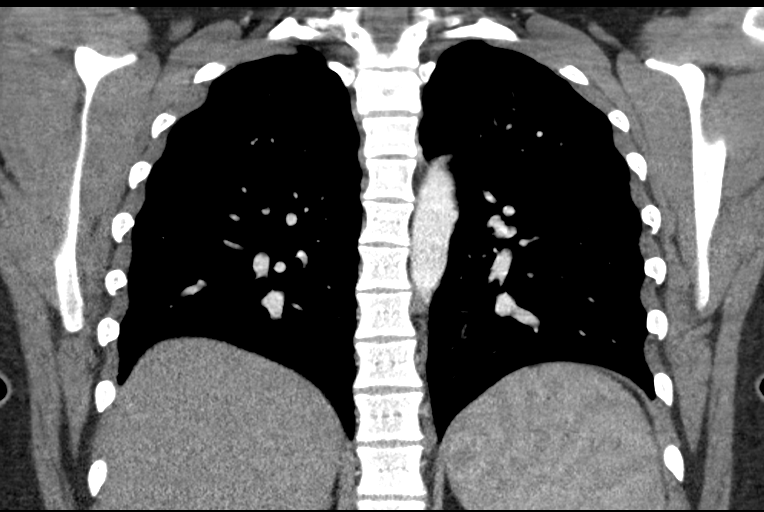

[18 of 46 positions shown; findings below may reference images not displayed]

FINDINGS: Cardiovascular: The heart size is normal. No substantial pericardial
effusion. No thoracic aortic aneurysm. Suboptimal bolus timing.
Within this limitation of bolus timing, there is no large central
filling defect within the pulmonary arteries to suggest an acute
pulmonary embolus. No evidence for filling defect in segmental or
subsegmental pulmonary arteries of either lung although assessment
may not be reliable due to bolus timing.

Mediastinum/Nodes: No mediastinal lymphadenopathy. There is no hilar
lymphadenopathy. The esophagus has normal imaging features. There is
no axillary lymphadenopathy.

Lungs/Pleura: No focal airspace consolidation. No pulmonary edema or
pleural effusion.

Upper Abdomen: Unremarkable.

Musculoskeletal: No worrisome lytic or sclerotic osseous
abnormality.

Review of the MIP images confirms the above findings.
IMPRESSION: 1. No large central pulmonary embolus. Segmental and subsegmental
pulmonary arteries show no filling defects to suggest pulmonary
embolism although assessment may be unreliable due to bolus timing.
2. Otherwise unremarkable exam.

## 2020-11-12 IMAGING — CR DG CHEST 2V
2 series · 2 of 2 positions shown · non-contrast
Comparison: None.

CLINICAL DATA: Chest pain

EXAM:
CHEST - 2 VIEW

[chest pa]
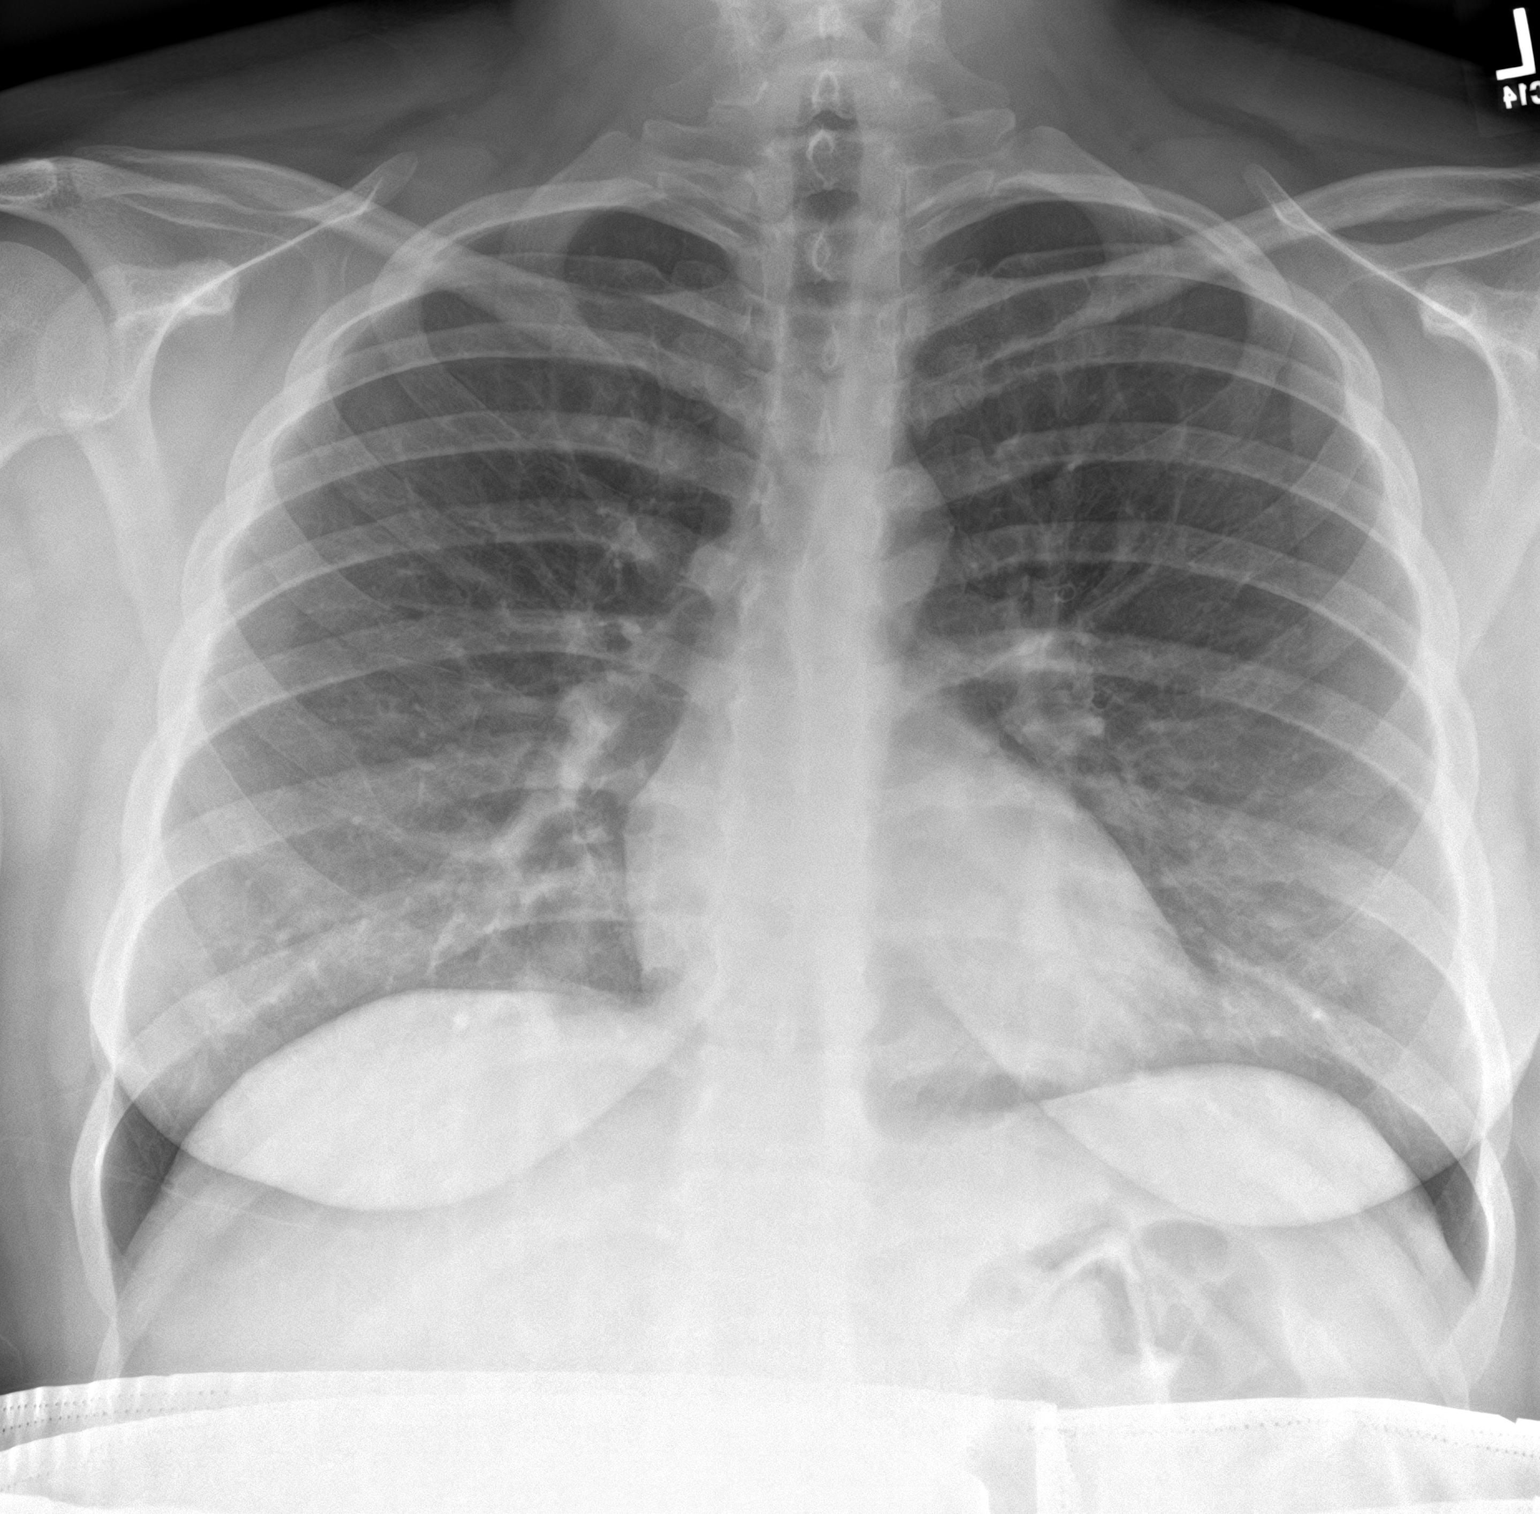

[chest lat]
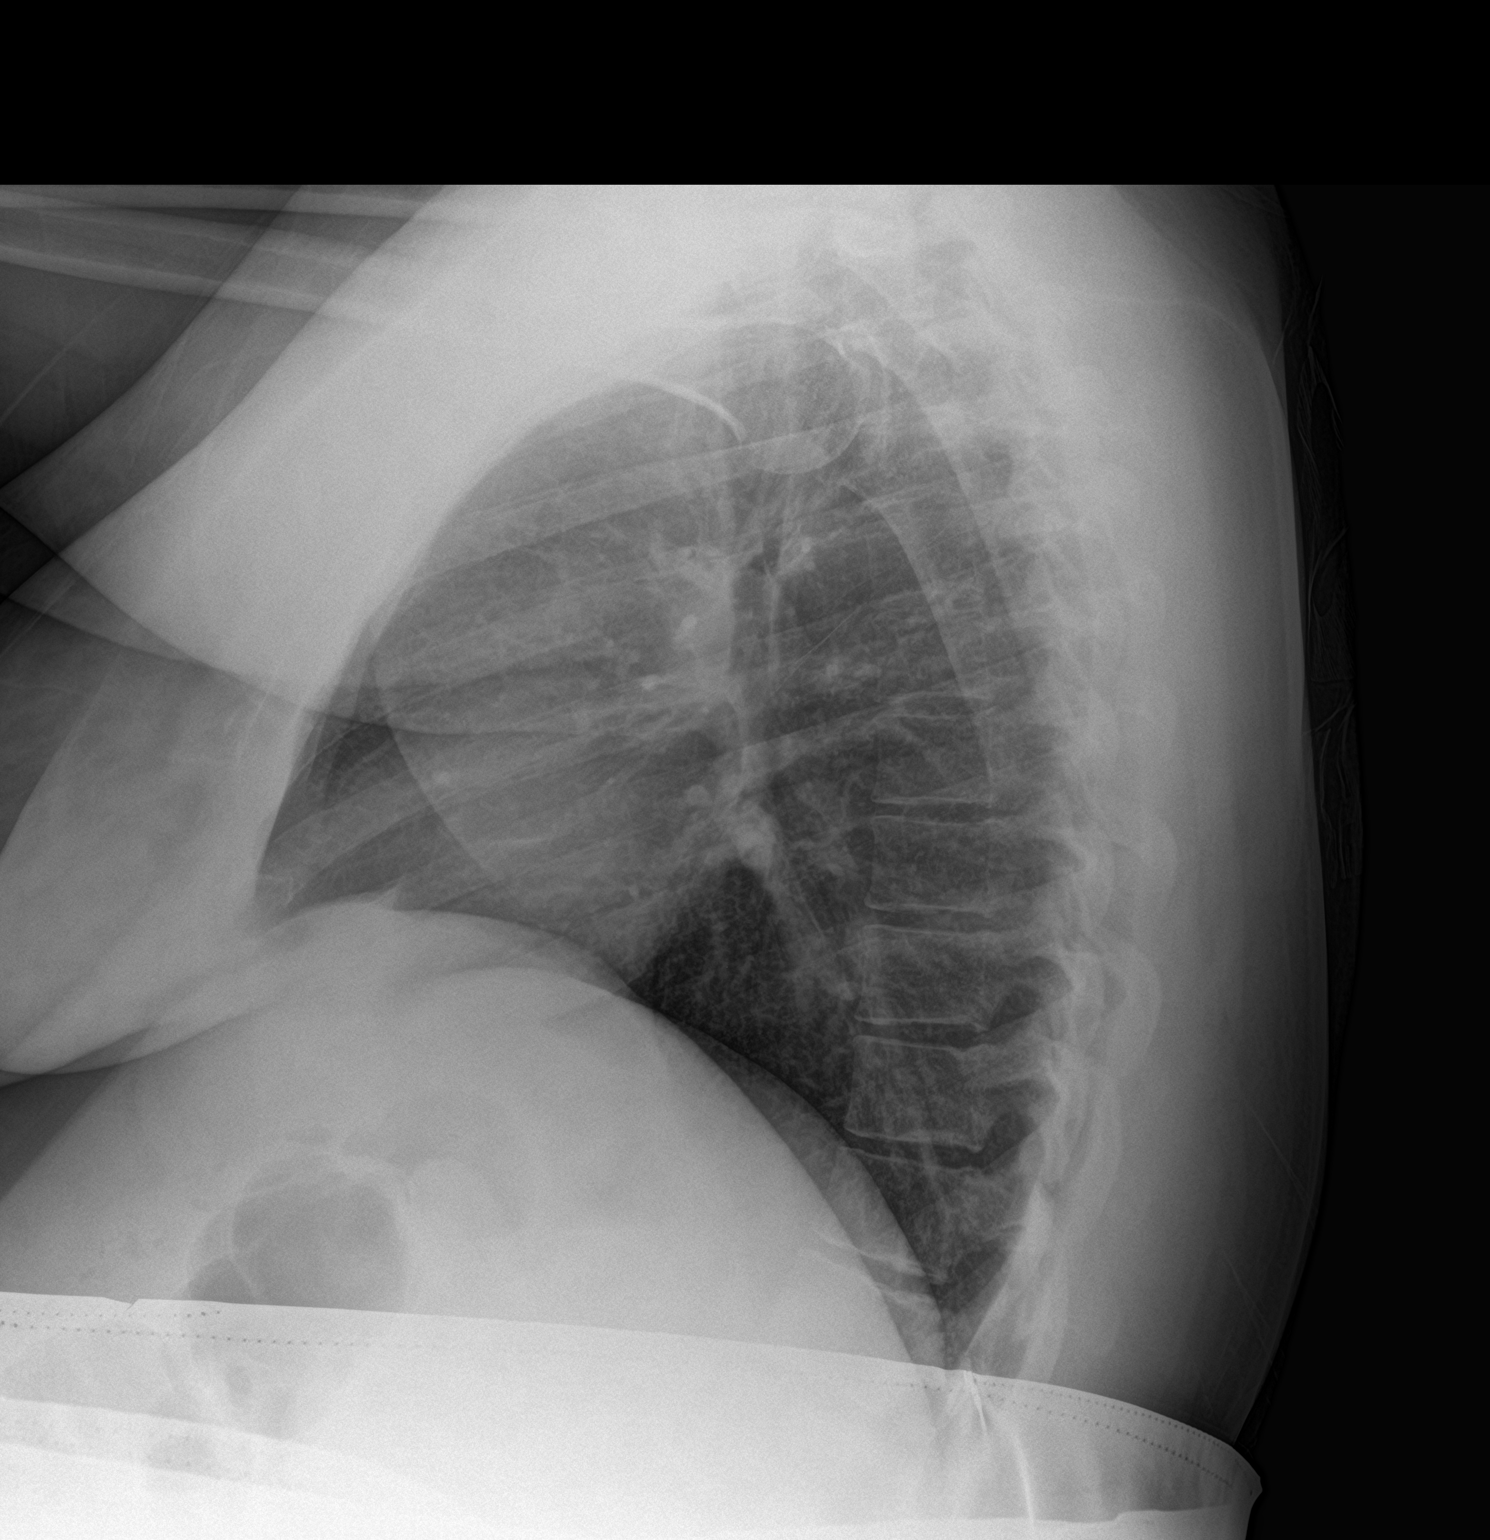

[2 of 2 positions shown; findings below may reference images not displayed]

FINDINGS: Lungs are clear. Heart size and pulmonary vascularity are normal. No
adenopathy. No pneumothorax. No bone lesions.
IMPRESSION: No abnormality noted.

## 2020-11-24 DIAGNOSIS — Z419 Encounter for procedure for purposes other than remedying health state, unspecified: Secondary | ICD-10-CM | POA: Diagnosis not present

## 2020-12-24 DIAGNOSIS — Z419 Encounter for procedure for purposes other than remedying health state, unspecified: Secondary | ICD-10-CM | POA: Diagnosis not present

## 2021-01-24 DIAGNOSIS — Z419 Encounter for procedure for purposes other than remedying health state, unspecified: Secondary | ICD-10-CM | POA: Diagnosis not present

## 2021-02-04 DIAGNOSIS — O0993 Supervision of high risk pregnancy, unspecified, third trimester: Secondary | ICD-10-CM | POA: Insufficient documentation

## 2021-02-18 ENCOUNTER — Other Ambulatory Visit: Payer: Self-pay

## 2021-02-18 ENCOUNTER — Ambulatory Visit: Payer: Medicaid Other | Attending: Obstetrics and Gynecology

## 2021-02-18 ENCOUNTER — Ambulatory Visit (HOSPITAL_BASED_OUTPATIENT_CLINIC_OR_DEPARTMENT_OTHER): Payer: Medicaid Other | Admitting: Obstetrics and Gynecology

## 2021-02-18 DIAGNOSIS — Z36 Encounter for antenatal screening for chromosomal anomalies: Secondary | ICD-10-CM

## 2021-02-18 DIAGNOSIS — Z818 Family history of other mental and behavioral disorders: Secondary | ICD-10-CM

## 2021-02-18 DIAGNOSIS — O10011 Pre-existing essential hypertension complicating pregnancy, first trimester: Secondary | ICD-10-CM

## 2021-02-18 DIAGNOSIS — O99211 Obesity complicating pregnancy, first trimester: Secondary | ICD-10-CM | POA: Diagnosis not present

## 2021-02-18 DIAGNOSIS — O99111 Other diseases of the blood and blood-forming organs and certain disorders involving the immune mechanism complicating pregnancy, first trimester: Secondary | ICD-10-CM | POA: Diagnosis not present

## 2021-02-18 DIAGNOSIS — D6852 Prothrombin gene mutation: Secondary | ICD-10-CM | POA: Diagnosis not present

## 2021-02-18 DIAGNOSIS — O10911 Unspecified pre-existing hypertension complicating pregnancy, first trimester: Secondary | ICD-10-CM

## 2021-02-18 DIAGNOSIS — D6859 Other primary thrombophilia: Secondary | ICD-10-CM | POA: Diagnosis not present

## 2021-02-18 DIAGNOSIS — Z3A09 9 weeks gestation of pregnancy: Secondary | ICD-10-CM

## 2021-02-18 DIAGNOSIS — E668 Other obesity: Secondary | ICD-10-CM

## 2021-02-18 NOTE — Progress Notes (Addendum)
[name] Length of Consultation: 40 minutes  [name]  was referred to [facility] at [facility] for genetic counseling to review her history of a Prothrombin gene mutation, family history and prenatal screening and testing options.  The patient presented to this visit alone. She also met with Dr. Tama High for an MFM consultation, which can be found under separate cover.  We first reviewed the family history and pregnancy history information obtained at her last genetic counseling visit in 2020. At that time, she was seen for low fetal fraction on NIPS, likely related to her use of Lovenox and BMI.  Repeat MaterniT21 in that pregnancy was normal. She also had negative carrier screening at that time for Cystic fibrosis, Spinal muscular atrophy, hemoglobinopathies and Fragile X syndrome. The Fragile X screening was made available due to a history of autism in a paternal first cousin. Since that time, her father has passed away due to nonspecific organ failure at age 61 years old.  She has no details on the cause for his condition.  Otherwise, she reports the birth of her Sierra (see below).  In the current pregnancy, [name] report no complications other than light spotting and no exposure to alcohol, recreational drugs or tobacco.  She is currently taking lovenox, aspirin and labetalol. This pregnancy is with the same partner. Ultrasound dating on 02/03/2021 at 37w4dis consistent with an ESumpterof 09/18/2021.  We reviewed the following aspects of her history in detail: Prior evaluation for thrombophilias. 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 At that time, Ms. ZScogginreports that she underwent testing and was also found to carry the Prothrombin mutation. 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 We reviewed the dominant inheritance of prothrombin variants.  There can be many causes for elevated Factor VIII, though no clear single gene etiologies. See MFM consult notes for recommendations given these lab findings.  There is a known association of both of these lab findings with venous thromboembolism and therefore consideration of anticoagulation, as she did in her last pregnancy, was reviewed with the patient by Dr. SDonalee Citrin  We did offer the option of repeating these lab studies to help assess this history further, though it is unlikely that the MFM recommendations would change regardless of those results given her past history.   Sierra with developmental delays. The patient stated concerns because her Sierra, [name](dob 02/27/2019), has shown developmental delays.  QIdolina Primeris almost 26years old and at this time has no words, does not follow commands and does not respond to her name.  She has had low muscle tone and late motor milestones including late rolling, crawling and walking at 120months of age. Her hearing is reported to be normal and she has begun playing with toys and engaging with other children in preschool recently. QIdolina Primerhas had play therapy and is beginning speech therapy and was evaluated by [facility] in GWeippeat 175 monthsold. Per Ms. ZSelvage she was developmentally at the 9 month level at that time. Ms. ZShoreyis concerned about the possible cause for her Sierra's condition as well as the possibility of developmental delays in the current pregnancy.  I have reached out to Pediatric Genetics to determine if any  evaluation at this time is possible to help assess the underlying cause for [name]'s delays as well as possible risks to this pregnancy. I will update this note when I hear from them.  We offered the following routine screening tests for this pregnancy:  Cell free fetal DNA testing from maternal  blood may be used to determine whether a baby is at high risk for Down syndrome, trisomy 54, or trisomy 83.  This test utilizes a maternal blood sample and DNA sequencing technology to isolate circulating cell free fetal DNA from maternal plasma.  The fetal DNA can then be analyzed for DNA sequences that are derived from the three most common chromosomes involved in aneuploidy, chromosomes 13, 18, and 21.  If the overall amount of DNA is greater than the expected level for any of these chromosomes, aneuploidy is suspected.  The detection rate for Down syndrome and trisomy 18 is >99% and the detection rate for trisomy 13 is >91%. While we do not consider it a replacement for invasive testing and karyotype analysis, a negative result from this testing would be reassuring, though not a guarantee of a normal chromosome complement for the baby.  An abnormal result is certainly suggestive of an abnormal chromosome complement, though we would still recommend CVS or amniocentesis to confirm any findings from this testing. This testing can also assess for the sex chromosomes and can detect approximately 96% of sex chromosome aneuploidies and determine fetal gender with >99% confidence.    Maternal serum marker screening, a blood test that measures pregnancy proteins, can provide risk assessments for Down syndrome, trisomy 18, and open neural tube defects (spina bifida, anencephaly). Because it does not directly examine the fetus, it cannot positively diagnose or rule out these problems. If chromosome screening has been performed, an AFP only for open neural tube defects is recommended.  Targeted ultrasound uses high frequency sound waves to create an image of the developing fetus.  An ultrasound is often recommended as a routine means of evaluating the pregnancy.  It is also used to screen for fetal anatomy problems (for example, a heart defect) that might be suggestive of a chromosomal or other abnormality.   Should  these screening tests indicate an increased concern, then the following additional testing options would be offered:  The chorionic villus sampling procedure is available for first trimester chromosome analysis.  This involves the withdrawal of a small amount of chorionic villi (tissue from the developing placenta).  Risk of pregnancy loss is estimated to be approximately 1 in 200 to 1 in 100 (0.5 to 1%).  There is approximately a 1% (1 in 100) chance that the CVS chromosome results will be unclear.  Chorionic villi cannot be tested for neural tube defects.     Amniocentesis involves the removal of a small amount of amniotic fluid from the sac surrounding the fetus with the use of a thin needle inserted through the maternal abdomen and uterus.  Ultrasound guidance is used throughout the procedure.  Fetal cells from amniotic fluid are directly evaluated and > 99.5% of chromosome problems and > 98% of open neural tube defects can be detected. This procedure is generally performed after the 15th week of pregnancy.  The main risks to this procedure include complications leading to miscarriage in less than 1 in 200 cases (0.5%).  [name] was encouraged to call with questions or concerns.  We can be contacted at [phone].  Plan of care: [name] desires MaterniT21 with SCA  testing at approximately [redacted] weeks gestation.  We would not recommend earlier screening due to the increased incidence of unreportable results due to low fetal fraction in women on Lovenox.  Dr. Donalee Citrin also offered the option of stopping the Lovenox two days prior to the blood draw for this testing. Carrier screening is not needed, as it was previously performed and was negative. Return to MFM in 4 weeks for a first trimester anatomy ultrasound and again at [redacted] weeks gestation for a detailed anatomy. See MFM consultation note for recommendations regarding Lovenox and timing of doses.  Can consider repeating the von Willebrand Panel  (to follow possible changes in Factor VIII levels) and Prothrombin variant testing (we have no documentation of this result). Recommend consideration of AFP only screening for open neural tube defects through her OB in the second trimester.   Wilburt Finlay, MS, CGC  Addendum:  02/25/21 [facility] is happy to see [name] for an evaluation. I spoke with Rosann Auerbach, Dr. Sammuel Bailiff nurse and [facility], regarding sending a referral for [name] to see [facility] at [facility].  I emailed the form to [place]. I also notified [name] to expect a call to arrange this appointment.

## 2021-02-18 NOTE — Progress Notes (Addendum)
Maternal-Fetal Medicine   Name: [name] DOB: 01-30-1995 MRN: [id number] Referring Provider: Drinda Butts, CNM  I had the pleasure of seeing Ms. Stockinger today at [facility], [facility].  She is G2 P1001 at 9w 5d gestation and is here for MFM consultation and genetic counseling. Her problems include: -Chronic hypertension. 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  Hypertension was diagnosed about 10 years ago when the patient was in high school.  She reports her systolic blood pressure was in the 160s and she became unconscious.  Patient does not remember the details.  Possibility of transient ischemic attack was raised.  Patient took labetalol in her previous pregnancy and reports did not seek medical care in the interval between pregnancies.  She takes labetalol 200 mg daily now and her blood pressures are well controlled blood pressure today at her office is 130s/90 mmHg.  Patient had routine thrombophilia work-up after her mother tested positive for prothrombin gene mutation.  She has prothrombin gene heterozygous mutation and increased to factor VIII levels.  No personal history of venous thromboembolism.  Review of systems: No headache or visual disturbances.  No chest pain.  Occasional palpitations.  No abdominal or joint pains.  No history of recurrent urinary tract infections.  No vaginal bleeding or dyspareunia.  Patient had spotting at around [redacted] weeks gestation.  No history of diabetes or thyroid disorder or any other chronic medical conditions.  Patient's BMI is 40 (class III obesity).  Past surgical history: Nil of note. Medications: Prenatal vitamins, low-dose aspirin, Lovenox 40 mg daily, labetalol. Allergies: No known drug allergies. Social history: Denies tobacco or drug or alcohol use.  She is engaged to be married.  Her partner is Caucasian and he is a father of her first child. Family history: No history of venous thromboembolism in the family.  Her  mother is in good health.  She does not have information about her father.  Obstetric history significant for a term vaginal delivery in February 2021 of a female infant weighing 6 pounds and 8 ounces at birth.  She had 1.2 L of blood loss and had postpartum anemia.  No blood transfusions were given.  Patient received iron infusions.  She took low molecular weight heparin throughout her pregnancy.  She reports her daughter has hypotonia and is being followed by pediatricians.  Carrier screening is negative for SMA.  GYN history: No history of abnormal Pap smears or cervical surgeries.  No history of breast disease.  Her menstrual cycles were irregular.  Her pregnancy is dated by 7-week ultrasound.  Our concerns include Chronic hypertension -Adverse outcomes of severe chronic hypertension include maternal stroke, endorgan damage, coagulation disturbances and placental abruption.  Superimposed preeclampsia occurs in 30% of women with chronic hypertension.  I discussed the benefit of low-dose aspirin prophylaxis that helps delaying or preventing preeclampsia.  -I advised her to discontinue aspirin now and start after 12 weeks' gestation.  -I discussed the safety profile of antihypertensives.  Labetalol can be safely given in pregnancy.  It can be associated with low birthweights.  Alternative medications include nifedipine and methyldopa.  -I discussed ultrasound protocol of monitoring fetal growth assessment and antenatal testing.  -Timing of delivery: Provided her blood pressures are well controlled, she can be delivered at 39 weeks' gestation.  Early term delivery is an option if hypertension is not well controlled.  Inherited thrombophilia -Following miscarriages, her mother had screening for thrombophilia and was found to have prothrombin gene mutation.  Patient  was subsequently screened.  She does not have personal history of thromboembolism.  -Prothrombin gene mutation alone does not warrant  anticoagulation in pregnancy.  Patient has increased her Factor VIII levels that can be associated with miscarriages and slight increase in venous thromboembolism.  She also had a questionable history of transient ischemic attack.  -I discussed the option of continuing Lovenox in this pregnancy unless she has severe side effects.  -Patient reports she has itching and is switching her injection sites.  She is willing to continue Lovenox.  -I discussed the side effects of Lovenox including rashes and osteoporosis leading to vertebral fractures (1 to 2% and occurs mainly with therapeutic dosages).  Calcium supplements (Tums) may be taken.  -She may discontinue Lovenox 48 hours before planned induction to prevent postpartum hemorrhage.  Patient met with our genetic counselor today and you will be receiving a separate letter.  She had a detailed discussion on cell free fetal DNA screening.  She had low fetal fraction in her previous pregnancy.  I counseled her to withhold Lovenox for 48 hours before cell free fetal DNA screening.  No clear benefits are reported on the performance of tests.  Recommendations -Discontinue aspirin now and resume after 12 weeks. -Continue lovenox 40 mg daily  and withhold lovenox for 48 hours before cell-free fetal DNA screening. -An appointment was made for her to return in 4 weeks of first of Mester anatomy. -Fetal anatomical survey at [redacted] weeks gestation. -Fetal growth assessments every 4 weeks. -Weekly BPP from [redacted] weeks gestation till delivery. -Delivery at [redacted] weeks gestation provided her blood pressures are well controlled. -Resume Lovenox 6 to 8 hours after vaginal delivery or about 12 hours after cesarean section till 6 weeks postpartum.   Thank you for consultation.  If you have any questions or concerns, please contact me the [facility].  Consultation including face-to-face counseling (more than 50% of time spent) is 45 minutes.

## 2021-02-24 DIAGNOSIS — Z419 Encounter for procedure for purposes other than remedying health state, unspecified: Secondary | ICD-10-CM | POA: Diagnosis not present

## 2021-02-25 ENCOUNTER — Telehealth: Payer: Self-pay | Admitting: Obstetrics and Gynecology

## 2021-02-25 NOTE — Telephone Encounter (Signed)
I spoke with [name] to let her know that I have emailed with Peds [facility]etics and they are happy to see [name] for an evaluation.  I also spoke with Mindi Junker, Dr. Dory Larsen nurse, at [facility] to request that they send a referral.  The referral form emailed to [facility] and they will put in that request.    Once that evaluation is performed, we will stay in touch regarding any possible testing options for this pregnancy.  Cherly Anderson, MS, CGC

## 2021-03-15 ENCOUNTER — Other Ambulatory Visit: Payer: Self-pay

## 2021-03-15 DIAGNOSIS — F419 Anxiety disorder, unspecified: Secondary | ICD-10-CM

## 2021-03-15 DIAGNOSIS — O10919 Unspecified pre-existing hypertension complicating pregnancy, unspecified trimester: Secondary | ICD-10-CM

## 2021-03-15 DIAGNOSIS — D6852 Prothrombin gene mutation: Secondary | ICD-10-CM

## 2021-03-16 ENCOUNTER — Ambulatory Visit: Payer: BC Managed Care – PPO | Attending: Maternal & Fetal Medicine

## 2021-03-16 ENCOUNTER — Other Ambulatory Visit: Payer: Self-pay

## 2021-03-16 DIAGNOSIS — F32A Depression, unspecified: Secondary | ICD-10-CM | POA: Diagnosis not present

## 2021-03-16 DIAGNOSIS — Z3A13 13 weeks gestation of pregnancy: Secondary | ICD-10-CM

## 2021-03-16 DIAGNOSIS — F419 Anxiety disorder, unspecified: Secondary | ICD-10-CM | POA: Diagnosis not present

## 2021-03-16 DIAGNOSIS — O10011 Pre-existing essential hypertension complicating pregnancy, first trimester: Secondary | ICD-10-CM | POA: Diagnosis not present

## 2021-03-16 DIAGNOSIS — O99341 Other mental disorders complicating pregnancy, first trimester: Secondary | ICD-10-CM | POA: Diagnosis not present

## 2021-03-16 DIAGNOSIS — D6852 Prothrombin gene mutation: Secondary | ICD-10-CM | POA: Insufficient documentation

## 2021-03-16 DIAGNOSIS — O26899 Other specified pregnancy related conditions, unspecified trimester: Secondary | ICD-10-CM | POA: Diagnosis not present

## 2021-03-16 DIAGNOSIS — O10919 Unspecified pre-existing hypertension complicating pregnancy, unspecified trimester: Secondary | ICD-10-CM | POA: Insufficient documentation

## 2021-03-16 DIAGNOSIS — O99340 Other mental disorders complicating pregnancy, unspecified trimester: Secondary | ICD-10-CM | POA: Diagnosis not present

## 2021-03-24 DIAGNOSIS — Z419 Encounter for procedure for purposes other than remedying health state, unspecified: Secondary | ICD-10-CM | POA: Diagnosis not present

## 2021-03-30 ENCOUNTER — Emergency Department
Admission: EM | Admit: 2021-03-30 | Discharge: 2021-03-30 | Disposition: A | Discharge status: Home / Self Care | Payer: BC Managed Care – PPO | Attending: Student in an Organized Health Care Education/Training Program | Admitting: Student in an Organized Health Care Education/Training Program

## 2021-03-30 ENCOUNTER — Other Ambulatory Visit: Payer: Self-pay

## 2021-03-30 ENCOUNTER — Encounter: Payer: Self-pay | Admitting: Emergency Medicine

## 2021-03-30 DIAGNOSIS — O26892 Other specified pregnancy related conditions, second trimester: Secondary | ICD-10-CM | POA: Insufficient documentation

## 2021-03-30 DIAGNOSIS — R109 Unspecified abdominal pain: Secondary | ICD-10-CM | POA: Diagnosis not present

## 2021-03-30 DIAGNOSIS — R824 Acetonuria: Secondary | ICD-10-CM | POA: Diagnosis not present

## 2021-03-30 DIAGNOSIS — O219 Vomiting of pregnancy, unspecified: Secondary | ICD-10-CM

## 2021-03-30 DIAGNOSIS — Z3A15 15 weeks gestation of pregnancy: Secondary | ICD-10-CM | POA: Diagnosis not present

## 2021-03-30 DIAGNOSIS — O210 Mild hyperemesis gravidarum: Secondary | ICD-10-CM | POA: Diagnosis not present

## 2021-03-30 LAB — URINALYSIS, ROUTINE W REFLEX MICROSCOPIC
Bacteria, UA: NONE SEEN
Bilirubin Urine: NEGATIVE
Glucose, UA: NEGATIVE mg/dL
Hgb urine dipstick: NEGATIVE
Ketones, ur: 80 mg/dL — AB
Nitrite: NEGATIVE
Protein, ur: 30 mg/dL — AB
Specific Gravity, Urine: 1.031 — ABNORMAL HIGH (ref 1.005–1.030)
pH: 5 (ref 5.0–8.0)

## 2021-03-30 LAB — COMPREHENSIVE METABOLIC PANEL
ALT: 26 U/L (ref 0–44)
AST: 31 U/L (ref 15–41)
Albumin: 3.6 g/dL (ref 3.5–5.0)
Alkaline Phosphatase: 51 U/L (ref 38–126)
Anion gap: 12 (ref 5–15)
BUN: 11 mg/dL (ref 6–20)
CO2: 21 mmol/L — ABNORMAL LOW (ref 22–32)
Calcium: 9.4 mg/dL (ref 8.9–10.3)
Chloride: 104 mmol/L (ref 98–111)
Creatinine, Ser: 0.65 mg/dL (ref 0.44–1.00)
GFR, Estimated: >60 mL/min (ref >60)
Glucose, Bld: 114 mg/dL — ABNORMAL HIGH (ref 70–99)
Potassium: 3.9 mmol/L (ref 3.5–5.1)
Sodium: 137 mmol/L (ref 135–145)
Total Bilirubin: 0.7 mg/dL (ref 0.3–1.2)
Total Protein: 7.5 g/dL (ref 6.5–8.1)

## 2021-03-30 LAB — CBC
HCT: 37.2 % (ref 36.0–46.0)
Hemoglobin: 12.1 g/dL (ref 12.0–15.0)
MCH: 26.1 pg (ref 26.0–34.0)
MCHC: 32.5 g/dL (ref 30.0–36.0)
MCV: 80.3 fL (ref 80.0–100.0)
Platelets: 269 10*3/uL (ref 150–400)
RBC: 4.63 MIL/uL (ref 3.87–5.11)
RDW: 13.6 % (ref 11.5–15.5)
WBC: 6.8 10*3/uL (ref 4.0–10.5)
nRBC: 0 % (ref 0.0–0.2)

## 2021-03-30 LAB — HCG, QUANTITATIVE, PREGNANCY: hCG, Beta Chain, Quant, S: 17962 m[IU]/mL — ABNORMAL HIGH (ref <5)

## 2021-03-30 LAB — LIPASE, BLOOD: Lipase: 34 U/L (ref 11–51)

## 2021-03-30 MED ORDER — SODIUM CHLORIDE 0.9 % IV BOLUS
1000.0000 mL | Freq: Once | INTRAVENOUS | Status: AC
  Administered 2021-03-30: 1000 mL via INTRAVENOUS

## 2021-03-30 MED ORDER — DEXTROSE 5 % IN LACTATED RINGERS IV BOLUS
1000.0000 mL | Freq: Once | INTRAVENOUS | Status: AC
  Administered 2021-03-30: 1000 mL via INTRAVENOUS
  Filled 2021-03-30: qty 1000

## 2021-03-30 MED ORDER — ACETAMINOPHEN 325 MG PO TABS
650.0000 mg | ORAL_TABLET | Freq: Once | ORAL | Status: AC
  Administered 2021-03-30: 650 mg via ORAL
  Filled 2021-03-30: qty 2

## 2021-03-30 MED ORDER — ONDANSETRON HCL 4 MG/2ML IJ SOLN
4.0000 mg | Freq: Once | INTRAMUSCULAR | Status: AC
  Administered 2021-03-30: 4 mg via INTRAVENOUS

## 2021-03-30 NOTE — ED Provider Notes (Signed)
? ?[facility] ?Provider Note ? ? ? Event Date/Time  ? First MD Initiated Contact with Patient 03/30/21 1941   ?  (approximate) ? ? ?History  ? ?Emesis During Pregnancy and Back Pain ? ? ?HPI ? ?[name] is a 26 y.o. female  G2P1001 presented to ER for evaluation of nausea vomiting some flank discomfort in setting of early pregnancy.  She is roughly 15 weeks.  Not having vaginal bleeding.  Physic her urine is darker in color.  Has had issues with hyperemesis morning sickness with previous pregnancy.  Still feeling the baby no measured fevers.  No dysuria. ?  ? ?  ? ? ?Physical Exam  ? ?Triage Vital Signs: ?ED Triage Vitals  ?Enc Vitals Group  ?   BP 03/30/21 1928 118/79  ?   Pulse Rate 03/30/21 1928 (!) 119  ?   Resp 03/30/21 1928 18  ?   Temp 03/30/21 1928 98.9 ?F (37.2 ?C)  ?   Temp Source 03/30/21 1928 Oral  ?   SpO2 03/30/21 1928 94 %  ?   Weight 03/30/21 1925 265 lb (120.2 kg)  ?   Height 03/30/21 1925 5\' 9"  (1.753 m)  ?   Head Circumference --   ?   Peak Flow --   ?   Pain Score 03/30/21 1925 7  ?   Pain Loc --   ?   Pain Edu? --   ?   Excl. in GC? --   ? ? ?Most recent vital signs: ?Vitals:  ? 03/30/21 1928  ?BP: 118/79  ?Pulse: (!) 119  ?Resp: 18  ?Temp: 98.9 ?F (37.2 ?C)  ?SpO2: 94%  ? ? ? ?Constitutional: Alert  ?Eyes: Conjunctivae are normal.  ?Head: Atraumatic. ?Nose: No congestion/rhinnorhea. ?Mouth/Throat: Mucous membranes are moist.   ?Neck: Painless ROM.  ?Cardiovascular:   Good peripheral circulation. ?Respiratory: Normal respiratory effort.  No retractions.  ?Gastrointestinal: Soft and nontender.  ?Musculoskeletal:  no deformity ?Neurologic:  MAE spontaneously. No gross focal neurologic deficits are appreciated.  ?Skin:  Skin is warm, dry and intact. No rash noted. ?Psychiatric: Mood and affect are normal. Speech and behavior are normal. ? ? ? ?ED Results / Procedures / Treatments  ? ?Labs ?(all labs ordered are listed, but only abnormal results are displayed) ?Labs  Reviewed  ?COMPREHENSIVE METABOLIC PANEL - Abnormal; Notable for the following components:  ?    Result Value  ? CO2 21 (*)   ? Glucose, Bld 114 (*)   ? All other components within normal limits  ?URINALYSIS, ROUTINE W REFLEX MICROSCOPIC - Abnormal; Notable for the following components:  ? Color, Urine AMBER (*)   ? APPearance CLOUDY (*)   ? Specific Gravity, Urine 1.031 (*)   ? Ketones, ur 80 (*)   ? Protein, ur 30 (*)   ? Leukocytes,Ua SMALL (*)   ? All other components within normal limits  ?HCG, QUANTITATIVE, PREGNANCY - Abnormal; Notable for the following components:  ? hCG, Beta Chain, Quant, S 17,962 (*)   ? All other components within normal limits  ?LIPASE, BLOOD  ?CBC  ? ? ? ?EKG ? ? ? ? ?RADIOLOGY ? ? ? ?PROCEDURES: ? ?Critical Care performed:  ? ?Procedures ? ? ?MEDICATIONS ORDERED IN ED: ?Medications  ?sodium chloride 0.9 % bolus 1,000 mL (0 mLs Intravenous Stopped 03/30/21 2157)  ?ondansetron [facility]) injection 4 mg (4 mg Intravenous Given 03/30/21 2014)  ?dextrose 5% lactated ringers bolus 1,000 mL (0 mLs Intravenous Stopped  03/30/21 2238)  ?acetaminophen (TYLENOL) tablet 650 mg (650 mg Oral Given 03/30/21 2157)  ? ? ? ?IMPRESSION / MDM / ASSESSMENT AND PLAN / ED COURSE  ?I reviewed the triage vital signs and the nursing notes. ?             ?               ? ?Differential diagnosis includes, but is not limited to, pyelo, uti, colitis, dehydration, hyperemesis, ketosis, stone, biliary pathology ? ?Presenting to the ER for sx as described above.  Patient is AFVSS in ED. Exam as above. Given current presentation have considered the above differential.  Abdominal exam is soft and benign.  FHT 145.  No guarding.  No bleeding or discharge.  Will check UA and blood work.  Will give IVF and zofran. ? ? ?Clinical Course as of 03/30/21 2249  ?Tue Mar 30, 2021  ?2027 Urinalysis with ketones we will give D5 LR bolus no sign of infection.  No leukocytosis no anemia. [PR]  ?2248 Patient feeling much improved.  Repeat exam  reassuring.  Given normal wbc, no fever and benign exam do not feel imaging clinically indicated at this time.  Patient tolerating po and requesting DC.  Does appear appropriate for a trial of outpatient management.  [PR]  ?  ?Clinical Course User Index ?[PR] Willy Eddy, MD  ? ? ? ?FINAL CLINICAL IMPRESSION(S) / ED DIAGNOSES  ? ?Final diagnoses:  ?Nausea and vomiting in pregnancy  ? ? ? ?Rx / DC Orders  ? ?ED Discharge Orders   ? ? None  ? ?  ? ? ? ?Note:  This document was prepared using Dragon voice recognition software and may include unintentional dictation errors. ? ?  ?Willy Eddy, MD ?03/30/21 2317 ? ?

## 2021-03-30 NOTE — ED Notes (Signed)
Iv started   meds given.  Pt in hallway bed.   ?

## 2021-03-30 NOTE — ED Notes (Signed)
Pt drinking water, alert, iv fluids infusing.  No n/v ?

## 2021-03-30 NOTE — ED Notes (Signed)
Pt POC was POSITIVE. 

## 2021-03-30 NOTE — ED Triage Notes (Signed)
Pt arrived via POV with reports of vomiting and "kidney pain" since last night, pt is [name].  G2P1 goes to [facility]. ? ?Denies any vaginal bleeding, no abd pain. ? ?Pt not taking any nausea medication at home. ? ?Pt reports some dark urine. Pt unable to keep BP meds down as well.  ?

## 2021-03-31 ENCOUNTER — Telehealth: Payer: Self-pay

## 2021-03-31 NOTE — Telephone Encounter (Signed)
Transition Care Management Unsuccessful Follow-up Telephone Call ? ?Date of discharge and from where:  03/30/2021-[facility] ? ?Attempts:  1st Attempt ? ?Reason for unsuccessful TCM follow-up call:  Voice mail full ? ?  ?

## 2021-04-01 NOTE — Telephone Encounter (Signed)
Transition Care Management Unsuccessful Follow-up Telephone Call ? ?Date of discharge and from where:  03/30/2021-[facility] ? ?Attempts:  2nd Attempt ? ?Reason for unsuccessful TCM follow-up call:  Voice mail full ? ?  ?

## 2021-04-02 NOTE — Telephone Encounter (Signed)
Transition Care Management Unsuccessful Follow-up Telephone Call ? ?Date of discharge and from where:  03/30/2021 from [facility] ? ?Attempts:  3rd Attempt ? ?Reason for unsuccessful TCM follow-up call:  Unable to reach patient ? ? ? ?

## 2021-04-22 ENCOUNTER — Other Ambulatory Visit: Payer: Self-pay

## 2021-04-22 DIAGNOSIS — Z818 Family history of other mental and behavioral disorders: Secondary | ICD-10-CM

## 2021-04-22 DIAGNOSIS — O10919 Unspecified pre-existing hypertension complicating pregnancy, unspecified trimester: Secondary | ICD-10-CM

## 2021-04-22 DIAGNOSIS — O09292 Supervision of pregnancy with other poor reproductive or obstetric history, second trimester: Secondary | ICD-10-CM

## 2021-04-22 DIAGNOSIS — F419 Anxiety disorder, unspecified: Secondary | ICD-10-CM

## 2021-04-22 DIAGNOSIS — O99212 Obesity complicating pregnancy, second trimester: Secondary | ICD-10-CM

## 2021-04-24 DIAGNOSIS — Z419 Encounter for procedure for purposes other than remedying health state, unspecified: Secondary | ICD-10-CM | POA: Diagnosis not present

## 2021-04-27 ENCOUNTER — Other Ambulatory Visit: Payer: Self-pay

## 2021-04-27 ENCOUNTER — Ambulatory Visit: Payer: BC Managed Care – PPO | Attending: Obstetrics and Gynecology

## 2021-04-27 DIAGNOSIS — O10012 Pre-existing essential hypertension complicating pregnancy, second trimester: Secondary | ICD-10-CM | POA: Insufficient documentation

## 2021-04-27 DIAGNOSIS — O99212 Obesity complicating pregnancy, second trimester: Secondary | ICD-10-CM | POA: Diagnosis not present

## 2021-04-27 DIAGNOSIS — Z818 Family history of other mental and behavioral disorders: Secondary | ICD-10-CM

## 2021-04-27 DIAGNOSIS — O09292 Supervision of pregnancy with other poor reproductive or obstetric history, second trimester: Secondary | ICD-10-CM | POA: Diagnosis not present

## 2021-04-27 DIAGNOSIS — Z87798 Personal history of other (corrected) congenital malformations: Secondary | ICD-10-CM | POA: Insufficient documentation

## 2021-04-27 DIAGNOSIS — Z363 Encounter for antenatal screening for malformations: Secondary | ICD-10-CM | POA: Insufficient documentation

## 2021-04-27 DIAGNOSIS — Z3A19 19 weeks gestation of pregnancy: Secondary | ICD-10-CM | POA: Diagnosis not present

## 2021-04-27 DIAGNOSIS — F419 Anxiety disorder, unspecified: Secondary | ICD-10-CM

## 2021-04-27 DIAGNOSIS — O99342 Other mental disorders complicating pregnancy, second trimester: Secondary | ICD-10-CM | POA: Diagnosis not present

## 2021-04-27 DIAGNOSIS — F32A Depression, unspecified: Secondary | ICD-10-CM

## 2021-04-27 DIAGNOSIS — E669 Obesity, unspecified: Secondary | ICD-10-CM

## 2021-04-27 DIAGNOSIS — O10919 Unspecified pre-existing hypertension complicating pregnancy, unspecified trimester: Secondary | ICD-10-CM

## 2021-05-24 DIAGNOSIS — Z419 Encounter for procedure for purposes other than remedying health state, unspecified: Secondary | ICD-10-CM | POA: Diagnosis not present

## 2021-06-01 ENCOUNTER — Encounter: Payer: Self-pay | Admitting: Urgent Care

## 2021-06-01 ENCOUNTER — Ambulatory Visit: Payer: BC Managed Care – PPO

## 2021-06-03 ENCOUNTER — Ambulatory Visit: Payer: Medicaid Other

## 2021-06-15 ENCOUNTER — Other Ambulatory Visit: Payer: Self-pay

## 2021-06-15 DIAGNOSIS — O99212 Obesity complicating pregnancy, second trimester: Secondary | ICD-10-CM

## 2021-06-15 DIAGNOSIS — O09292 Supervision of pregnancy with other poor reproductive or obstetric history, second trimester: Secondary | ICD-10-CM

## 2021-06-15 DIAGNOSIS — D6852 Prothrombin gene mutation: Secondary | ICD-10-CM

## 2021-06-15 DIAGNOSIS — O99213 Obesity complicating pregnancy, third trimester: Secondary | ICD-10-CM

## 2021-06-15 DIAGNOSIS — Z818 Family history of other mental and behavioral disorders: Secondary | ICD-10-CM

## 2021-06-15 DIAGNOSIS — F32A Depression, unspecified: Secondary | ICD-10-CM

## 2021-06-15 DIAGNOSIS — O10919 Unspecified pre-existing hypertension complicating pregnancy, unspecified trimester: Secondary | ICD-10-CM

## 2021-06-17 ENCOUNTER — Other Ambulatory Visit: Payer: Self-pay

## 2021-06-17 ENCOUNTER — Ambulatory Visit: Payer: BC Managed Care – PPO | Attending: Obstetrics and Gynecology

## 2021-06-17 DIAGNOSIS — O26892 Other specified pregnancy related conditions, second trimester: Secondary | ICD-10-CM

## 2021-06-17 DIAGNOSIS — D6852 Prothrombin gene mutation: Secondary | ICD-10-CM | POA: Diagnosis not present

## 2021-06-17 DIAGNOSIS — Z818 Family history of other mental and behavioral disorders: Secondary | ICD-10-CM | POA: Diagnosis not present

## 2021-06-17 DIAGNOSIS — O99891 Other specified diseases and conditions complicating pregnancy: Secondary | ICD-10-CM | POA: Diagnosis not present

## 2021-06-17 DIAGNOSIS — O99213 Obesity complicating pregnancy, third trimester: Secondary | ICD-10-CM

## 2021-06-17 DIAGNOSIS — Z79899 Other long term (current) drug therapy: Secondary | ICD-10-CM | POA: Diagnosis not present

## 2021-06-17 DIAGNOSIS — O99212 Obesity complicating pregnancy, second trimester: Secondary | ICD-10-CM | POA: Insufficient documentation

## 2021-06-17 DIAGNOSIS — O10919 Unspecified pre-existing hypertension complicating pregnancy, unspecified trimester: Secondary | ICD-10-CM

## 2021-06-17 DIAGNOSIS — O99342 Other mental disorders complicating pregnancy, second trimester: Secondary | ICD-10-CM | POA: Insufficient documentation

## 2021-06-17 DIAGNOSIS — O10912 Unspecified pre-existing hypertension complicating pregnancy, second trimester: Secondary | ICD-10-CM | POA: Insufficient documentation

## 2021-06-17 DIAGNOSIS — F419 Anxiety disorder, unspecified: Secondary | ICD-10-CM

## 2021-06-17 DIAGNOSIS — Z3A26 26 weeks gestation of pregnancy: Secondary | ICD-10-CM | POA: Insufficient documentation

## 2021-06-24 DIAGNOSIS — Z419 Encounter for procedure for purposes other than remedying health state, unspecified: Secondary | ICD-10-CM | POA: Diagnosis not present

## 2021-07-13 ENCOUNTER — Other Ambulatory Visit: Payer: Self-pay

## 2021-07-13 DIAGNOSIS — O10919 Unspecified pre-existing hypertension complicating pregnancy, unspecified trimester: Secondary | ICD-10-CM

## 2021-07-13 DIAGNOSIS — O99213 Obesity complicating pregnancy, third trimester: Secondary | ICD-10-CM

## 2021-07-13 DIAGNOSIS — D6852 Prothrombin gene mutation: Secondary | ICD-10-CM

## 2021-07-15 ENCOUNTER — Other Ambulatory Visit: Payer: Self-pay

## 2021-07-15 ENCOUNTER — Ambulatory Visit: Payer: Medicaid Other | Attending: Maternal & Fetal Medicine

## 2021-07-15 DIAGNOSIS — Z3A30 30 weeks gestation of pregnancy: Secondary | ICD-10-CM | POA: Diagnosis not present

## 2021-07-15 DIAGNOSIS — Z87798 Personal history of other (corrected) congenital malformations: Secondary | ICD-10-CM | POA: Diagnosis not present

## 2021-07-15 DIAGNOSIS — O99213 Obesity complicating pregnancy, third trimester: Secondary | ICD-10-CM | POA: Diagnosis not present

## 2021-07-15 DIAGNOSIS — O10913 Unspecified pre-existing hypertension complicating pregnancy, third trimester: Secondary | ICD-10-CM | POA: Insufficient documentation

## 2021-07-15 DIAGNOSIS — O10013 Pre-existing essential hypertension complicating pregnancy, third trimester: Secondary | ICD-10-CM | POA: Diagnosis not present

## 2021-07-15 DIAGNOSIS — O26893 Other specified pregnancy related conditions, third trimester: Secondary | ICD-10-CM

## 2021-07-15 DIAGNOSIS — D6852 Prothrombin gene mutation: Secondary | ICD-10-CM | POA: Diagnosis not present

## 2021-07-15 DIAGNOSIS — O10919 Unspecified pre-existing hypertension complicating pregnancy, unspecified trimester: Secondary | ICD-10-CM

## 2021-07-24 DIAGNOSIS — Z419 Encounter for procedure for purposes other than remedying health state, unspecified: Secondary | ICD-10-CM | POA: Diagnosis not present

## 2021-07-28 ENCOUNTER — Encounter: Payer: Self-pay | Admitting: Urgent Care

## 2021-07-29 ENCOUNTER — Other Ambulatory Visit: Payer: Self-pay

## 2021-07-29 ENCOUNTER — Ambulatory Visit: Payer: BC Managed Care – PPO | Attending: Obstetrics

## 2021-07-29 ENCOUNTER — Encounter
Admission: RE | Admit: 2021-07-29 | Discharge: 2021-07-29 | Disposition: A | Discharge status: Home / Self Care | Payer: BC Managed Care – PPO | Source: Ambulatory Visit | Attending: Urgent Care | Admitting: Urgent Care

## 2021-07-29 DIAGNOSIS — O10913 Unspecified pre-existing hypertension complicating pregnancy, third trimester: Secondary | ICD-10-CM | POA: Insufficient documentation

## 2021-07-29 DIAGNOSIS — D6852 Prothrombin gene mutation: Secondary | ICD-10-CM | POA: Diagnosis not present

## 2021-07-29 DIAGNOSIS — Z3A32 32 weeks gestation of pregnancy: Secondary | ICD-10-CM | POA: Diagnosis not present

## 2021-07-29 DIAGNOSIS — O10013 Pre-existing essential hypertension complicating pregnancy, third trimester: Secondary | ICD-10-CM | POA: Diagnosis not present

## 2021-07-29 DIAGNOSIS — O99213 Obesity complicating pregnancy, third trimester: Secondary | ICD-10-CM | POA: Diagnosis not present

## 2021-07-29 DIAGNOSIS — O10919 Unspecified pre-existing hypertension complicating pregnancy, unspecified trimester: Secondary | ICD-10-CM

## 2021-07-29 DIAGNOSIS — O99113 Other diseases of the blood and blood-forming organs and certain disorders involving the immune mechanism complicating pregnancy, third trimester: Secondary | ICD-10-CM | POA: Diagnosis not present

## 2021-07-29 DIAGNOSIS — Z87798 Personal history of other (corrected) congenital malformations: Secondary | ICD-10-CM | POA: Diagnosis not present

## 2021-07-29 DIAGNOSIS — E669 Obesity, unspecified: Secondary | ICD-10-CM

## 2021-07-29 HISTORY — DX: Shortness of breath: R06.02

## 2021-07-29 HISTORY — DX: Palpitations: R00.2

## 2021-07-29 HISTORY — DX: Chest pain, unspecified: R07.9

## 2021-07-29 HISTORY — DX: Mild hyperemesis gravidarum: O21.0

## 2021-07-29 NOTE — Consult Note (Signed)
Perioperative Services  OB/GYN PRE-ANESTHESIA CONSULTATION  Date: 07/29/21  Patient Demographics:  Name: [name] DOB:   11/12/1995 MRN:   [id number]  NOTE: Available PAT nursing documentation and vital signs have been reviewed. Available clinical information reviewed in efforts to assist in medical decision making as it pertains to the aforementioned procedure and anticipated anesthetic course.   Pertinent OB/GYN Information:  Birth plan as of 07/29/21: patient plans to deliver via SVD with epidural placement for intrapartum analgesia. Patient ultimately desires a Caesarean section, however due to her having a elevated factor VIII level and prothrombin gene mutation, her OB team has advised her that this was not a safe option for her unless things became emergent.    OB History     Gravida  2   Para  1   Term  1   Preterm  0   AB  0   Living  1      SAB  0   IAB  0   Ectopic  0   Multiple  0   Live Births  1      Clinical Discussion:  [name] is a 26 y.o. female presenting for pre-anesthesia evaluation prior to planned SVB with [facility] OB/GYN providers. She reports that her main obstetrician has been Dr. Bethany Beasley, MD. Patient is a [redacted]w[redacted]d G2P2A0 with an EDC of 09/18/2021. Due to apprehension regarding previous epidural placement, in addition to a history of perinatal maternal obesity, patient has been submitted for pre-delivery consultation with anesthesia team.  Patient is a Former Smoker (quit 05/2015). She denies the use/abuse of any type of substances, including ETOH during this pregnancy. Pertinent PMH includes: rest angina, SOB, HTN, pre-diabetes, HLD, palpitations, prothrombin gene mutation, elevated factor VIII levels, menorrhagia, multiple chlamydia infections, pancreatitis, hyperemesis gravidarum, anxiety, and depression.   Patient recent seen in consult by cardiology (Callwood, MD) for assessment on palpitations, SOB, HTN,  peripheral edema, and tachycardia; notes reviewed. At the time of her clinic visit patient denied any associated PND, orthopnea, vertiginous symptoms, or presyncope/syncope. Blood pressure was reasonably controlled at 132/78 on prescribed beta blocker therapy (labetalol 200 mg BID). No medications for HLD diagnosis or ASCVD prevention. Not known to be diabetic. Given symptoms, cardiology decided to pursue a non-invasive workup via Holter and TTE. Holter study pending. TTE was normal (EF >55%).  Cardiology recommended potential addition of diltiazem for recurrent palpitations and tachycardia.  Patient scheduled to follow-up with outpatient cardiology at the 33-week mark for ongoing evaluation and management.  Patient was last seen in the OB/GYN clinic on 07/14/2021; notes reviewed. Intrauterine pregnancy (IUP) has been confirmed by her OB/GYN team via appropriate ultrasound imaging. She has given birth in the past via SVD with epidural support. She denies any known previous complications associated with her pregnancies and/or labor. Patient advising that she has received appropriate prenatal care throughout her current pregnancy and verbalizes that everything is on track for her to deliver via original birthing plan. Per patient report, her weight is about the same as it was prior to conception. She advises that she had significant issues with hyperemesis gravidarum early in the course of her pregnancy, which caused her to actually lose about 20 pounds. Patient reports that she is only now getting back to her pre-pregnancy weight.  Regarding her elevated factor VIII level and prothrombin gene mutation, patient has never had any manifestations associated with these findings; no DVT/PE.  Patient has been on prophylactic enoxaparin dose throughout pregnancy.   She was seen by hematology back in 2014, and following her evaluation with them, she was advised to RTC on a PRN basis.   Menarche age 42. Lifelong history of  irregular menses, with cycles typically last 7 days. Patient found to have prothrombin gene mutation as a child (10/2010) discovered in the setting of workup for menometrorrhagia. Started on metformin, which has since been discontinued, for presumed metabolic syndrome and PCOS.    Von Willebrand's panel was performed on 02/23/2012 revealing a factor VIII level 400%. Von Willebrand's antigen was normal at 138%. These findings were consistent with a hypercoagulable state.  Patient's mother has the same mutations and has a history of early fetal demise x 2.    Patient advises that she does not have any type of known issues with her cervical or lumbar spine.  She has not had any type of surgical procedures, whether with or without hardware placement, on her neck or lower back.   Patient denies previous perioperative complications with anesthesia in the past. She has never received general anesthesia per her report. Patient has only ever undergone conscious sedation for extraction of her wisdom teeth in 2013. Patient did not experience any difficulties with her anesthetic course. She denies any known family history of anesthetic complications.   She has never been intubated. She advises that she has never been advised by a medical provider that she had a challenging airway due to her anatomy.   Patient reports that underwent epidural placement with her last pregnancy in 02/2019. She describes a complicated course. Patient prepped and draped as per protocol. Local anesthesia was provided. Patient admitted that she was very nervous. Initial attempt at placement caused her leg to "jump", which in turn led to patient having an "emotional breakdown". She reports that the anesthesiologist became upset and the procedure was aborted. Patient had to be given IV lorazepam prior to procedure being attempted again. Second attempt was successful and provided adequate analgesia for her delivery. Post-partum, patient  developed significant back pain. During a follow up with her OB/GYN, patient advised that she had significant bruising that the L4-L5 area where epidural was placed. Patient reports persistent  "sensitivity" and "nerve pain" with palpation of the area.   Patient has never been diagnosed with OSAH syndrome requiring the use of prescribed nocturnal PAP therapy.   In review the patient's available EMR, it is noted that she underwent a epidural placement here at [facility] ([facility]) in 02/2019 with no documented complications.   Providers/Specialists:   NOTE: Primary physician provider listed below. Patient may have been seen by APP or partner within same practice.   PROVIDER ROLE / SPECIALTY LAST OV  [facility]  OB/GYN (Surgeon) 07/14/2021  Benjaman Kindler, MD Primary Care Provider 07/14/2021  Katrine Coho, MD Cardiology 07/07/2021   Allergies:  Estrogens  Current Home Medications:    acetaminophen (TYLENOL) 500 MG tablet   aspirin 81 MG chewable tablet   citalopram (CELEXA) 20 MG tablet   enoxaparin (LOVENOX) 40 MG/0.4ML injection   famotidine (PEPCID) 20 MG tablet   ferrous sulfate 325 (65 FE) MG tablet   ibuprofen (ADVIL) 600 MG tablet   labetalol (NORMODYNE) 200 MG tablet   Prenatal Vit-Fe Fumarate-FA (PRENATAL MULTIVITAMIN) TABS tablet   No current facility-administered medications for this encounter.   History:   Past Medical History:  Diagnosis Date   Anxiety    Chest pain at rest    Chlamydia 6/15, 11/15, 2/16  Depression    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   a.) Von Willebrand panel 02/23/2012 --> FVIII 400%; VWF Ag normal at 138%; consistent with hypercoagulable state   Esophageal reflux    Family history of breast cancer 05/08/2013   HLD (hyperlipidemia)    Hyperemesis gravidarum    Hypertension 02/2011   Irregular menses 03/19/2012   Menorrhagia 2012   a.) menarche age 11; h/o irregular menses; menstrual  cycles last 7 days; started Depo Provera injection in 10/2010, however self discontinued in 09/2011 due to 50# weight gain since starting   Mononucleosis 01/08/2014   [facility]   Palpitations    Pancreatitis 2012   Pre-diabetes 2014   Proctalgia    Intermittently   Prothrombin gene mutation (HCC) 10/2010   SOB (shortness of breath)    Vitamin D deficiency    Past Surgical History:  Procedure Laterality Date   WISDOM TOOTH EXTRACTION N/A 2013   Family History  Problem Relation Age of Onset   Clotting disorder Mother    Breast cancer Paternal Aunt    Hypertension Maternal Grandmother    Heart disease Maternal Grandfather    Heart attack Maternal Grandfather    Stroke Maternal Grandfather    Kidney disease Sister    Social History   Tobacco Use   Smoking status: Former    Types: Cigarettes    Quit date: 05/25/2015    Years since quitting: 6.1   Smokeless tobacco: Never  Vaping Use   Vaping Use: Former   Quit date: 05/25/2018  Substance Use Topics   Alcohol use: Not Currently   Drug use: No    Review of Systems:  Constitutional: Negative.   HENT: Negative.    Eyes: Negative.   Respiratory:  Negative for cough, shortness of breath and wheezing.   Cardiovascular:  Negative for chest pain, palpitations and leg swelling.  Gastrointestinal: Negative.  Negative for abdominal pain, heartburn, nausea and vomiting.  Musculoskeletal:  Negative for back pain, falls and neck pain.  Skin:  Negative for rash.  Neurological:  Negative for dizziness and headaches.  Endo/Heme/Allergies: Negative.   Psychiatric/Behavioral:  Negative for depression, substance abuse and suicidal ideas. The patient is not nervous/anxious.  Physical Examination:  Vital signs that were obtained during visit have been reviewed as follows:  Estimated body mass index is 39.53 kg/m as calculated from the following:   Height as of this encounter: 5' 9" (1.753 m).   Weight as of this encounter: 121.4  kg.  Constitutional:      Appearance: Normal appearance. Seated comfortably. Diaphoretic due to temperature outside and in exam room.  HENT:     Head: Normocephalic and atraumatic.     Mouth/Throat:     Mouth: Mucous membranes are moist.     Pharynx: Oropharynx is clear.     Comments: Mallampati: Class 3: Can visualize soft palate.           Thyromental distance: > 3 FB/cm  Neck:     Comments: Supple. FROM without stiffness or pain. Able to touch chin to chest. Cardiovascular:     Rate and Rhythm: Normal rate and regular rhythm.     Pulses: Normal pulses.     Heart sounds: Normal heart sounds.  Pulmonary:     Effort: Pulmonary effort is normal.     Breath sounds: Normal breath sounds.  Abdominal:     Comments: (+) gravid abdomen with complaints of significant pressure. (+) fetal movement; level of activity at baseline (not decreased).    Genitourinary:    Comments: Exam deferred; denies vaginal pain/discharge/bleeding.  Musculoskeletal:        General: No swelling. Normal range of motion.     Cervical back: Neck supple. No rigidity or tenderness.     Lumbar back: (+) "back contractions"; generalized back pain secondary to gravid abdomen. TTP reported at level of L4-L5.  Skin:    General: Skin is warm and diaphoretic.     Findings: No rash.  Neurological:     General: No focal deficit present.     Mental Status: She is alert and oriented to person, place, and time. Mental status is at baseline.     Gait: Gait normal.  Psychiatric:        Mood and Affect: Anxious.        Behavior: Behavior normal.        Thought Content: Thought content normal.        Judgment: Judgment normal.   Pertinent Clinical Results:    Ref Range & Units 06/29/2021  WBC (White Blood Cell Count) 4.1 - 10.2 10^3/uL 8.2   RBC (Red Blood Cell Count) 4.04 - 5.48 10^6/uL 4.34   Hemoglobin 12.0 - 15.0 gm/dL 11.3 Low    Hematocrit 35.0 - 47.0 % 34.2 Low    MCV (Mean Corpuscular Volume) 80.0 - 100.0 fl 78.8  Low    MCH (Mean Corpuscular Hemoglobin) 27.0 - 31.2 pg 26.0 Low    MCHC (Mean Corpuscular Hemoglobin Concentration) 32.0 - 36.0 gm/dL 33.0   Platelet Count 150 - 450 10^3/uL 290   RDW-CV (Red Cell Distribution Width) 11.6 - 14.8 % 13.4   MPV (Mean Platelet Volume) 9.4 - 12.4 fl 9.6   Neutrophils 1.50 - 7.80 10^3/uL 5.33   Lymphocytes 1.00 - 3.60 10^3/uL 2.27   Monocytes 0.00 - 1.50 10^3/uL 0.37   Eosinophils 0.00 - 0.55 10^3/uL 0.14   Basophils 0.00 - 0.09 10^3/uL 0.02   Neutrophil % 32.0 - 70.0 % 65.5   Lymphocyte % 10.0 - 50.0 % 27.9   Monocyte % 4.0 - 13.0 % 4.5   Eosinophil % 1.0 - 5.0 % 1.7   Basophil% 0.0 - 2.0 % 0.2   Immature Granulocyte % <=0.7 % 0.2   Immature Granulocyte Count <=0.06 10^3/L 0.02   Resulting Agency  [facility]    Ref Range & Units 06/29/2021  Glucose 70 - 110 mg/dL 121 High    Sodium 136 - 145 mmol/L 137   Potassium 3.6 - 5.1 mmol/L 3.7   Chloride 97 - 109 mmol/L 104   Carbon Dioxide (CO2) 22.0 - 32.0 mmol/L 22.2   Urea Nitrogen (BUN) 7 - 25 mg/dL 6 Low    Creatinine 0.6 - 1.1 mg/dL 0.7   Glomerular Filtration Rate (eGFR), MDRD Estimate >60 mL/min/1.73sq m 101   Calcium 8.7 - 10.3 mg/dL 9.1   AST  8 - 39 U/L 11   ALT  5 - 38 U/L 8   Alk Phos (alkaline Phosphatase) 34 - 104 U/L 56   Albumin 3.5 - 4.8 g/dL 3.7   Bilirubin, Total 0.3 - 1.2 mg/dL 0.3   Protein, Total 6.1 - 7.9 g/dL 6.4   A/G Ratio 1.0 - 5.0 gm/dL 1.4   Resulting Agency  [facility]    ECG: Date: 07/07/2021 Rate: 119 bpm Rhythm: sinus tachycardia Axis (leads I and aVF): Normal Intervals: PR 128 ms. QRS 76 ms. QTc 419 ms. ST segment and T wave changes: No evidence   of acute ST segment elevation or depression Comparison: Similar to previous tracing obtained on 09/20/2018 NOTE: Tracing obtained at [facility]; unable for review. Above based on cardiologist's interpretation.    IMAGING / PROCEDURES: [place] MFM OB FOLLOW UP performed on  07/15/2021 Num Of Fetuses:  1 Fetal Heart Rate: 137 bpm Cardiac Activity: Observed Presentation: Breech Placenta: Posterior P. Cord Insertion: Visualized, central AFI FV:  Within normal limits AFI Sum (cm)     %Tile       Largest Pocket (cm)            16.28                59          6.05  RUQ (cm)       RLQ (cm)     LUQ (cm)        LLQ (cm)           3.88              2.69            3.66                  6.05  Impression and Plan:  [name] has been referred for pre-anesthesia review and clearance due to perinatal obesity and prior issues with epidural placement. She was seen today at 33w5dgestation with a body mass index of 39.53 kg/m. Available labs, pertinent testing, and imaging results were personally reviewed by me at the time of consult. Past medical/surgical history reviewed and discussed with patient. If noted to be applicable to patient, previous anesthetic courses discussed to determine past history of complications associated with her receiving anesthesia.   Analgesic options during labor were reviewed with patient. Typically epidural anesthesia is utilized, however this is based on patient preference and ability of anesthesia to successfully place an epidural catheter. Discussed epidural anesthesia and the potential challenges that are associated with this type of anesthesia. Placement is more difficult in patients who have a higher BMI, and even in placement is successful, there is increased risk of catheter migration from the epidural space. Additionally, discussed both neuraxial and general anesthetic courses should Cesarean delivery become necessary due to decompensation of maternal and/or fetal condition, or should this delivery option be a part of this patient's original birth plan. Given patient's BMI, she is at increased risk of intubation difficulties during her pregnancy. Reassurance provided regarding the skill set of our anesthesia team. Presented advanced airway  technology options (i.e. video laryngoscopy, fiberoptic bronchoscope, etc.) that we have at our disposal that can help mitigate potential complications associated with securing a definitive airway. Discussed that epidural anesthesia is generally safe and that post procedural back pain is often related to posture and weight changes. Reviewed risks associated with epidural/neuraxial anesthesia including spinal/epidural hematoma, infection, nerve damage, PDPH, hypotension, generalized pruritis, and nausea. Allowed time for questions, which were fielded to patient's satisfaction. She verbalized understanding of what was reviewed today.   Following meeting with patient today, she does not have any significant findings that would preclude [facility] Note, her body mass index os 39.53 kg/m does present challenges as discussed above. However, given her Mallampati score of 3, this patient is deemed to be safe to deliver here at our facility. Based on clinical review and physical exam performed today (07/29/21), the patient does not need to be seen back by anesthesia  team prior to delivery. With that being said, patient will meet with anesthesia team (MD and/or CRNA) on this day of her procedure for further evaluation/assessment prior to her delivery.   The anesthesia team is appreciative of her OB/GYN team for involving our service early in this patient's care. Early consultation allows for pre-delivery planning and risk stratification. This ultimately promotes and ensures that our mutual patient will receive the safest and most effective care possible. So again, we appreciate the consult. Please reach out with any questions or concerns that arise during [name]'s perinatal course. We look forward to working with everyone through this patient's delivery.   Bryan Gray, MSN, APRN, FNP-C, CEN [facility]  Peri-operative Services  Nurse Practitioner Phone: [phone] Fax: [phone] 07/29/21 1:58 PM  NOTE: This note has been prepared using Dragon dictation software. Despite my best ability to proofread, there is always the potential that unintentional transcriptional errors may still occur from this process.  

## 2021-08-03 ENCOUNTER — Encounter: Payer: Self-pay | Admitting: *Deleted

## 2021-08-03 ENCOUNTER — Ambulatory Visit: Payer: BC Managed Care – PPO | Attending: Maternal & Fetal Medicine

## 2021-08-03 ENCOUNTER — Ambulatory Visit: Payer: BC Managed Care – PPO | Admitting: *Deleted

## 2021-08-03 VITALS — BP 130/89 | HR 106

## 2021-08-03 DIAGNOSIS — O10919 Unspecified pre-existing hypertension complicating pregnancy, unspecified trimester: Secondary | ICD-10-CM | POA: Diagnosis present

## 2021-08-03 DIAGNOSIS — Z6836 Body mass index (BMI) 36.0-36.9, adult: Secondary | ICD-10-CM | POA: Insufficient documentation

## 2021-08-03 DIAGNOSIS — O99213 Obesity complicating pregnancy, third trimester: Secondary | ICD-10-CM | POA: Diagnosis present

## 2021-08-03 DIAGNOSIS — D6852 Prothrombin gene mutation: Secondary | ICD-10-CM

## 2021-08-11 ENCOUNTER — Other Ambulatory Visit: Payer: Self-pay

## 2021-08-11 DIAGNOSIS — D6852 Prothrombin gene mutation: Secondary | ICD-10-CM

## 2021-08-11 DIAGNOSIS — O10919 Unspecified pre-existing hypertension complicating pregnancy, unspecified trimester: Secondary | ICD-10-CM

## 2021-08-11 DIAGNOSIS — O99213 Obesity complicating pregnancy, third trimester: Secondary | ICD-10-CM

## 2021-08-12 ENCOUNTER — Other Ambulatory Visit: Payer: Self-pay

## 2021-08-12 ENCOUNTER — Ambulatory Visit: Payer: BC Managed Care – PPO | Attending: Obstetrics

## 2021-08-12 DIAGNOSIS — D6852 Prothrombin gene mutation: Secondary | ICD-10-CM | POA: Diagnosis not present

## 2021-08-12 DIAGNOSIS — Z3A34 34 weeks gestation of pregnancy: Secondary | ICD-10-CM | POA: Insufficient documentation

## 2021-08-12 DIAGNOSIS — O10919 Unspecified pre-existing hypertension complicating pregnancy, unspecified trimester: Secondary | ICD-10-CM

## 2021-08-12 DIAGNOSIS — O10913 Unspecified pre-existing hypertension complicating pregnancy, third trimester: Secondary | ICD-10-CM | POA: Insufficient documentation

## 2021-08-12 DIAGNOSIS — O99213 Obesity complicating pregnancy, third trimester: Secondary | ICD-10-CM | POA: Diagnosis not present

## 2021-08-12 DIAGNOSIS — O99113 Other diseases of the blood and blood-forming organs and certain disorders involving the immune mechanism complicating pregnancy, third trimester: Secondary | ICD-10-CM | POA: Insufficient documentation

## 2021-08-12 DIAGNOSIS — O10013 Pre-existing essential hypertension complicating pregnancy, third trimester: Secondary | ICD-10-CM | POA: Diagnosis not present

## 2021-08-12 DIAGNOSIS — E669 Obesity, unspecified: Secondary | ICD-10-CM

## 2021-08-13 NOTE — Progress Notes (Unsigned)
Orders in for induction :

## 2021-08-17 ENCOUNTER — Other Ambulatory Visit: Payer: Self-pay

## 2021-08-17 DIAGNOSIS — O99213 Obesity complicating pregnancy, third trimester: Secondary | ICD-10-CM

## 2021-08-17 DIAGNOSIS — D6852 Prothrombin gene mutation: Secondary | ICD-10-CM

## 2021-08-17 DIAGNOSIS — O10919 Unspecified pre-existing hypertension complicating pregnancy, unspecified trimester: Secondary | ICD-10-CM

## 2021-08-19 ENCOUNTER — Ambulatory Visit: Payer: BC Managed Care – PPO | Attending: Obstetrics

## 2021-08-19 ENCOUNTER — Other Ambulatory Visit: Payer: Self-pay

## 2021-08-19 DIAGNOSIS — O285 Abnormal chromosomal and genetic finding on antenatal screening of mother: Secondary | ICD-10-CM

## 2021-08-19 DIAGNOSIS — O10913 Unspecified pre-existing hypertension complicating pregnancy, third trimester: Secondary | ICD-10-CM | POA: Insufficient documentation

## 2021-08-19 DIAGNOSIS — O99213 Obesity complicating pregnancy, third trimester: Secondary | ICD-10-CM | POA: Diagnosis not present

## 2021-08-19 DIAGNOSIS — O10919 Unspecified pre-existing hypertension complicating pregnancy, unspecified trimester: Secondary | ICD-10-CM

## 2021-08-19 DIAGNOSIS — E669 Obesity, unspecified: Secondary | ICD-10-CM

## 2021-08-19 DIAGNOSIS — O10013 Pre-existing essential hypertension complicating pregnancy, third trimester: Secondary | ICD-10-CM | POA: Diagnosis not present

## 2021-08-19 DIAGNOSIS — D6852 Prothrombin gene mutation: Secondary | ICD-10-CM

## 2021-08-19 DIAGNOSIS — Z3A35 35 weeks gestation of pregnancy: Secondary | ICD-10-CM | POA: Diagnosis not present

## 2021-08-24 ENCOUNTER — Other Ambulatory Visit: Payer: Self-pay

## 2021-08-24 ENCOUNTER — Observation Stay
Admission: EM | Admit: 2021-08-24 | Discharge: 2021-08-25 | Disposition: A | Discharge status: Home / Self Care | Payer: BC Managed Care – PPO | Attending: Obstetrics and Gynecology | Admitting: Obstetrics and Gynecology

## 2021-08-24 ENCOUNTER — Encounter: Payer: Self-pay | Admitting: Obstetrics and Gynecology

## 2021-08-24 DIAGNOSIS — O4693 Antepartum hemorrhage, unspecified, third trimester: Secondary | ICD-10-CM | POA: Diagnosis not present

## 2021-08-24 DIAGNOSIS — Z79899 Other long term (current) drug therapy: Secondary | ICD-10-CM | POA: Insufficient documentation

## 2021-08-24 DIAGNOSIS — O133 Gestational [pregnancy-induced] hypertension without significant proteinuria, third trimester: Secondary | ICD-10-CM | POA: Diagnosis not present

## 2021-08-24 DIAGNOSIS — O99213 Obesity complicating pregnancy, third trimester: Secondary | ICD-10-CM | POA: Insufficient documentation

## 2021-08-24 DIAGNOSIS — Z6841 Body Mass Index (BMI) 40.0 and over, adult: Secondary | ICD-10-CM | POA: Insufficient documentation

## 2021-08-24 DIAGNOSIS — Z3A36 36 weeks gestation of pregnancy: Secondary | ICD-10-CM | POA: Diagnosis not present

## 2021-08-24 DIAGNOSIS — E669 Obesity, unspecified: Secondary | ICD-10-CM | POA: Diagnosis not present

## 2021-08-24 DIAGNOSIS — I10 Essential (primary) hypertension: Secondary | ICD-10-CM | POA: Diagnosis not present

## 2021-08-24 DIAGNOSIS — Z7982 Long term (current) use of aspirin: Secondary | ICD-10-CM | POA: Diagnosis not present

## 2021-08-24 DIAGNOSIS — Z87891 Personal history of nicotine dependence: Secondary | ICD-10-CM | POA: Insufficient documentation

## 2021-08-24 DIAGNOSIS — O99891 Other specified diseases and conditions complicating pregnancy: Secondary | ICD-10-CM | POA: Diagnosis present

## 2021-08-24 DIAGNOSIS — O10919 Unspecified pre-existing hypertension complicating pregnancy, unspecified trimester: Secondary | ICD-10-CM

## 2021-08-24 DIAGNOSIS — Z419 Encounter for procedure for purposes other than remedying health state, unspecified: Secondary | ICD-10-CM | POA: Diagnosis not present

## 2021-08-24 MED ORDER — LABETALOL HCL 100 MG PO TABS
200.0000 mg | ORAL_TABLET | Freq: Two times a day (BID) | ORAL | Status: DC
  Administered 2021-08-25: 200 mg via ORAL
  Filled 2021-08-24: qty 2

## 2021-08-24 NOTE — OB Triage Note (Signed)
Pt states that she is a G2 P1 with vaginal bleeding but states that she is having pain when urinating. Pt was swabbed in the office for GBS and others today and has noted bloog since that time.

## 2021-08-24 NOTE — Discharge Summary (Signed)
[name] is a 26 y.o. female. She is at [redacted]w[redacted]d gestation. No LMP recorded. Patient is pregnant. 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 Also c/o pain with urination.   S: Resting comfortably. no CTX, no VB.no LOF,  Active fetal movement. Denies: HA, visual changes, SOB, or RUQ/epigastric pain  Maternal Medical History:   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   a.) Von Willebrand panel 02/23/2012 --> FVIII 400%; VWF Ag normal at 138%; consistent with hypercoagulable state   Esophageal reflux    Family history of breast cancer 05/08/2013   HLD (hyperlipidemia)    Hyperemesis gravidarum    Hypertension 02/2011   Irregular menses 03/19/2012   Menorrhagia 2012   a.) menarche age 13; h/o irregular menses; menstrual cycles last 7 days; started Depo Provera injection in 10/2010, however self discontinued in 09/2011 due to 50# weight gain since starting   Mononucleosis 01/08/2014   [facility]   Palpitations    Pancreatitis 2012   Pre-diabetes 2014   Proctalgia    Intermittently   Prothrombin gene mutation (HCC) 10/2010   SOB (shortness of breath)    Vitamin D deficiency     Past Surgical History:  Procedure Laterality Date   WISDOM TOOTH EXTRACTION N/A 2013    Allergies  Allergen Reactions   Estrogens Other (See Comments)    Hx clotting disorder    Prior to Admission medications   Medication Sig Start Date End Date Taking? Authorizing Provider  aspirin 81 MG chewable tablet Chew 81 mg by mouth daily.   Yes [provider]  labetalol (NORMODYNE) 200 MG tablet Take 200 mg by mouth 2 (two) times daily.   Yes [provider]  Prenatal Vit-Fe Fumarate-FA (PRENATAL MULTIVITAMIN) TABS tablet Take 1 tablet by mouth daily at 12 noon.   Yes [provider]  acetaminophen (TYLENOL) 500 MG tablet Take 2 tablets (1,000 mg total) by mouth every 6 (six) hours as needed for mild pain or moderate pain. 03/01/19   [name], CNM  citalopram (CELEXA) 20 MG tablet Take 20 mg by mouth daily. Patient not taking: Reported on 07/29/2021    [provider]  enoxaparin (LOVENOX) 40 MG/0.4ML injection Inject 0.4 mLs (40 mg total) into the skin at bedtime. Patient not taking: Reported on 07/29/2021 03/01/19   [name], CNM  famotidine (PEPCID) 20 MG tablet Take 20 mg by mouth 2 (two) times daily.    [provider]  ferrous sulfate 325 (65 FE) MG tablet Take 1 tablet (325 mg total) by mouth 2 (two) times daily with a meal. Patient not taking: Reported on 08/03/2021 03/01/19   [name], CNM      Social History: She  reports that she quit smoking about 6 years ago. Her smoking use included cigarettes. She has never used smokeless tobacco. She reports that she does not currently use alcohol. She reports that she does not use drugs.  Family History: family history includes Breast cancer in her paternal aunt; Clotting disorder  in her mother; Heart attack in her maternal grandfather; Heart disease in her maternal grandfather; Hypertension in her maternal grandmother; Kidney disease in her sister; Stroke in her maternal grandfather.   Review of Systems: A full review of systems was performed and negative except as noted in the HPI.     O:  BP (!) 162/93   Pulse 100   Temp 98.1 F (36.7 C) (Oral)   Resp (!) 21   Ht 5\' 9"  (1.753 m)   Wt 122 kg   BMI 39.72 kg/m  Results for orders placed or performed during the hospital encounter of 08/24/21 (from the past 48 hour(s))   Wet prep, genital   Collection Time: 08/25/21 12:03 AM  Result Value Ref Range   Yeast Wet Prep HPF POC NONE SEEN NONE SEEN   Trich, Wet Prep NONE SEEN NONE SEEN   Clue Cells Wet Prep HPF POC NONE SEEN NONE SEEN   WBC, Wet Prep HPF POC >=10 (A) <10   Sperm NONE SEEN   Urinalysis, Complete w Microscopic   Collection Time: 08/25/21 12:03 AM  Result Value Ref Range   Color, Urine YELLOW (A) YELLOW   APPearance CLOUDY (A) CLEAR   Specific Gravity, Urine 1.029 1.005 - 1.030   pH 5.0 5.0 - 8.0   Glucose, UA NEGATIVE NEGATIVE mg/dL   Hgb urine dipstick LARGE (A) NEGATIVE   Bilirubin Urine NEGATIVE NEGATIVE   Ketones, ur 5 (A) NEGATIVE mg/dL   Protein, ur 30 (A) NEGATIVE mg/dL   Nitrite NEGATIVE NEGATIVE   Leukocytes,Ua MODERATE (A) NEGATIVE   RBC / HPF 21-50 0 - 5 RBC/hpf   WBC, UA 21-50 0 - 5 WBC/hpf   Bacteria, UA RARE (A) NONE SEEN   Squamous Epithelial / LPF 21-50 0 - 5   Mucus PRESENT    Ca Oxalate Crys, UA PRESENT      Constitutional: NAD, AAOx3  HE/ENT: extraocular movements grossly intact, moist mucous membranes CV: RRR PULM: nl respiratory effort, CTABL     Abd: gravid, non-tender, non-distended, soft      Ext: Non-tender, Nonedematous   Psych: mood appropriate, speech normal Pelvic: SSE done- mod amount cloudy ahderent vaginal discharge, cervix friable, bleeding with touch of speculum.  SVE: 1/50/-3, posterior, soft  Fetal  monitoring: Cat I Appropriate for GA Baseline: 140bpm Variability: moderate Accelerations: present x >2 Decelerations absent Time 12-04[birth year]1970    A/P: 26 y.o. [redacted]w[redacted]d here for antenatal surveillance   Principle Diagnosis:  vaginal bleeding, 36wks  Preterm labor: not present.  Fetal Wellbeing: Reassuring Cat 1 tracing, Reactive NST  CHTN but had not taken meds this evening, given dose of Labetalol now, repeat CMP and PCR done in office today, pt asymptomatic.  Wet prep sent- unremarkable UA sent- possible UTI, culture pending. Rx Keflex to  pharmacy.  D/c home stable, precautions reviewed, follow-up as scheduled.    [redacted]w[redacted]d, CNM 08/25/2021  12:51 AM

## 2021-08-25 DIAGNOSIS — Z6841 Body Mass Index (BMI) 40.0 and over, adult: Secondary | ICD-10-CM | POA: Diagnosis not present

## 2021-08-25 DIAGNOSIS — E669 Obesity, unspecified: Secondary | ICD-10-CM | POA: Diagnosis not present

## 2021-08-25 DIAGNOSIS — O4693 Antepartum hemorrhage, unspecified, third trimester: Secondary | ICD-10-CM | POA: Diagnosis not present

## 2021-08-25 DIAGNOSIS — O133 Gestational [pregnancy-induced] hypertension without significant proteinuria, third trimester: Secondary | ICD-10-CM | POA: Diagnosis not present

## 2021-08-25 DIAGNOSIS — Z79899 Other long term (current) drug therapy: Secondary | ICD-10-CM | POA: Diagnosis not present

## 2021-08-25 DIAGNOSIS — O99213 Obesity complicating pregnancy, third trimester: Secondary | ICD-10-CM | POA: Diagnosis not present

## 2021-08-25 DIAGNOSIS — Z3A36 36 weeks gestation of pregnancy: Secondary | ICD-10-CM | POA: Diagnosis not present

## 2021-08-25 DIAGNOSIS — I10 Essential (primary) hypertension: Secondary | ICD-10-CM | POA: Diagnosis not present

## 2021-08-25 DIAGNOSIS — Z87891 Personal history of nicotine dependence: Secondary | ICD-10-CM | POA: Diagnosis not present

## 2021-08-25 DIAGNOSIS — Z7982 Long term (current) use of aspirin: Secondary | ICD-10-CM | POA: Diagnosis not present

## 2021-08-25 LAB — WET PREP, GENITAL
Clue Cells Wet Prep HPF POC: NONE SEEN
Sperm: NONE SEEN
Trich, Wet Prep: NONE SEEN
WBC, Wet Prep HPF POC: >=10 — AB (ref <10)
Yeast Wet Prep HPF POC: NONE SEEN

## 2021-08-25 LAB — URINALYSIS, COMPLETE (UACMP) WITH MICROSCOPIC
Bilirubin Urine: NEGATIVE
Glucose, UA: NEGATIVE mg/dL
Ketones, ur: 5 mg/dL — AB
Nitrite: NEGATIVE
Protein, ur: 30 mg/dL — AB
Specific Gravity, Urine: 1.029 (ref 1.005–1.030)
pH: 5 (ref 5.0–8.0)

## 2021-08-25 MED ORDER — CEPHALEXIN 500 MG PO CAPS: 500.0000 mg | ORAL_CAPSULE | Freq: Two times a day (BID) | ORAL | 0 refills | Status: DC

## 2021-08-26 ENCOUNTER — Other Ambulatory Visit: Payer: Self-pay

## 2021-08-26 ENCOUNTER — Ambulatory Visit: Payer: BC Managed Care – PPO | Attending: Obstetrics

## 2021-08-26 DIAGNOSIS — E669 Obesity, unspecified: Secondary | ICD-10-CM | POA: Diagnosis not present

## 2021-08-26 DIAGNOSIS — O10913 Unspecified pre-existing hypertension complicating pregnancy, third trimester: Secondary | ICD-10-CM | POA: Insufficient documentation

## 2021-08-26 DIAGNOSIS — Z3A36 36 weeks gestation of pregnancy: Secondary | ICD-10-CM | POA: Diagnosis not present

## 2021-08-26 DIAGNOSIS — O99213 Obesity complicating pregnancy, third trimester: Secondary | ICD-10-CM | POA: Diagnosis not present

## 2021-08-26 DIAGNOSIS — O10919 Unspecified pre-existing hypertension complicating pregnancy, unspecified trimester: Secondary | ICD-10-CM

## 2021-08-26 DIAGNOSIS — O10013 Pre-existing essential hypertension complicating pregnancy, third trimester: Secondary | ICD-10-CM

## 2021-08-26 LAB — URINE CULTURE

## 2021-08-31 ENCOUNTER — Inpatient Hospital Stay: Payer: BC Managed Care – PPO | Admitting: Anesthesiology

## 2021-08-31 ENCOUNTER — Encounter
Admission: EM | Disposition: A | Discharge status: Home / Self Care | Payer: Self-pay | Source: Home / Self Care | Attending: Obstetrics

## 2021-08-31 ENCOUNTER — Other Ambulatory Visit: Payer: Self-pay

## 2021-08-31 ENCOUNTER — Inpatient Hospital Stay
Admission: EM | Admit: 2021-08-31 | Discharge: 2021-09-02 | DRG: 787 | Disposition: A | Discharge status: Home / Self Care | Payer: BC Managed Care – PPO | Attending: Obstetrics | Admitting: Obstetrics

## 2021-08-31 ENCOUNTER — Encounter: Payer: Self-pay | Admitting: Obstetrics and Gynecology

## 2021-08-31 ENCOUNTER — Other Ambulatory Visit: Payer: BC Managed Care – PPO

## 2021-08-31 DIAGNOSIS — F32A Depression, unspecified: Secondary | ICD-10-CM | POA: Diagnosis present

## 2021-08-31 DIAGNOSIS — O10919 Unspecified pre-existing hypertension complicating pregnancy, unspecified trimester: Principal | ICD-10-CM

## 2021-08-31 DIAGNOSIS — O99214 Obesity complicating childbirth: Secondary | ICD-10-CM | POA: Diagnosis present

## 2021-08-31 DIAGNOSIS — O9912 Other diseases of the blood and blood-forming organs and certain disorders involving the immune mechanism complicating childbirth: Secondary | ICD-10-CM | POA: Diagnosis present

## 2021-08-31 DIAGNOSIS — O1002 Pre-existing essential hypertension complicating childbirth: Principal | ICD-10-CM | POA: Diagnosis present

## 2021-08-31 DIAGNOSIS — O99344 Other mental disorders complicating childbirth: Secondary | ICD-10-CM | POA: Diagnosis present

## 2021-08-31 DIAGNOSIS — O9903 Anemia complicating the puerperium: Secondary | ICD-10-CM | POA: Diagnosis not present

## 2021-08-31 DIAGNOSIS — Z3A37 37 weeks gestation of pregnancy: Secondary | ICD-10-CM

## 2021-08-31 DIAGNOSIS — D6852 Prothrombin gene mutation: Secondary | ICD-10-CM | POA: Diagnosis present

## 2021-08-31 DIAGNOSIS — F419 Anxiety disorder, unspecified: Secondary | ICD-10-CM | POA: Diagnosis present

## 2021-08-31 DIAGNOSIS — Z7982 Long term (current) use of aspirin: Secondary | ICD-10-CM

## 2021-08-31 LAB — CBC
HCT: 36.1 % (ref 36.0–46.0)
Hemoglobin: 11.6 g/dL — ABNORMAL LOW (ref 12.0–15.0)
MCH: 24.2 pg — ABNORMAL LOW (ref 26.0–34.0)
MCHC: 32.1 g/dL (ref 30.0–36.0)
MCV: 75.2 fL — ABNORMAL LOW (ref 80.0–100.0)
Platelets: 323 10*3/uL (ref 150–400)
RBC: 4.8 MIL/uL (ref 3.87–5.11)
RDW: 14.7 % (ref 11.5–15.5)
WBC: 7.7 10*3/uL (ref 4.0–10.5)
nRBC: 0 % (ref 0.0–0.2)

## 2021-08-31 LAB — COMPREHENSIVE METABOLIC PANEL
ALT: 10 U/L (ref 0–44)
AST: 18 U/L (ref 15–41)
Albumin: 3.4 g/dL — ABNORMAL LOW (ref 3.5–5.0)
Alkaline Phosphatase: 83 U/L (ref 38–126)
Anion gap: 9 (ref 5–15)
BUN: 11 mg/dL (ref 6–20)
CO2: 21 mmol/L — ABNORMAL LOW (ref 22–32)
Calcium: 9.4 mg/dL (ref 8.9–10.3)
Chloride: 108 mmol/L (ref 98–111)
Creatinine, Ser: 0.66 mg/dL (ref 0.44–1.00)
GFR, Estimated: >60 mL/min (ref >60)
Glucose, Bld: 85 mg/dL (ref 70–99)
Potassium: 3.8 mmol/L (ref 3.5–5.1)
Sodium: 138 mmol/L (ref 135–145)
Total Bilirubin: 0.4 mg/dL (ref 0.3–1.2)
Total Protein: 7.7 g/dL (ref 6.5–8.1)

## 2021-08-31 LAB — PROTEIN / CREATININE RATIO, URINE
Creatinine, Urine: 227 mg/dL
Protein Creatinine Ratio: 0.06 mg/mg{Cre} (ref 0.00–0.15)
Total Protein, Urine: 14 mg/dL

## 2021-08-31 LAB — TYPE AND SCREEN
ABO/RH(D): A POS
Antibody Screen: NEGATIVE

## 2021-08-31 SURGERY — Surgical Case
Anesthesia: Epidural

## 2021-08-31 MED ORDER — TERBUTALINE SULFATE 1 MG/ML IJ SOLN: 0.2500 mg | Freq: Once | INTRAMUSCULAR | Status: DC | PRN

## 2021-08-31 MED ORDER — SOD CITRATE-CITRIC ACID 500-334 MG/5ML PO SOLN
ORAL | Status: AC
  Filled 2021-08-31: qty 15

## 2021-08-31 MED ORDER — ACETAMINOPHEN 325 MG PO TABS: 650.0000 mg | ORAL_TABLET | ORAL | Status: DC | PRN

## 2021-08-31 MED ORDER — PHENYLEPHRINE 80 MCG/ML (10ML) SYRINGE FOR IV PUSH (FOR BLOOD PRESSURE SUPPORT): 80.0000 ug | PREFILLED_SYRINGE | INTRAVENOUS | Status: DC | PRN

## 2021-08-31 MED ORDER — EPHEDRINE 5 MG/ML INJ: 10.0000 mg | INTRAVENOUS | Status: DC | PRN

## 2021-08-31 MED ORDER — LABETALOL HCL 5 MG/ML IV SOLN
INTRAVENOUS | Status: AC
  Administered 2021-08-31: 20 mg via INTRAVENOUS
  Filled 2021-08-31: qty 4

## 2021-08-31 MED ORDER — OXYTOCIN-SODIUM CHLORIDE 30-0.9 UT/500ML-% IV SOLN
INTRAVENOUS | Status: AC
  Administered 2021-08-31: 2 m[IU]/min via INTRAVENOUS
  Filled 2021-08-31: qty 500

## 2021-08-31 MED ORDER — HYDRALAZINE HCL 20 MG/ML IJ SOLN: 10.0000 mg | INTRAMUSCULAR | Status: DC | PRN

## 2021-08-31 MED ORDER — FENTANYL-BUPIVACAINE-NACL 0.5-0.125-0.9 MG/250ML-% EP SOLN
EPIDURAL | Status: DC | PRN
  Administered 2021-08-31: 12 mL/h via EPIDURAL

## 2021-08-31 MED ORDER — SODIUM CHLORIDE (PF) 0.9 % IJ SOLN
INTRAMUSCULAR | Status: AC
  Filled 2021-08-31: qty 50

## 2021-08-31 MED ORDER — OXYTOCIN-SODIUM CHLORIDE 30-0.9 UT/500ML-% IV SOLN
1.0000 m[IU]/min | INTRAVENOUS | Status: DC
  Administered 2021-08-31: 2 m[IU]/min via INTRAVENOUS

## 2021-08-31 MED ORDER — LABETALOL HCL 100 MG PO TABS
ORAL_TABLET | ORAL | Status: AC
  Administered 2021-08-31: 200 mg via ORAL
  Filled 2021-08-31: qty 2

## 2021-08-31 MED ORDER — LIDOCAINE-EPINEPHRINE (PF) 1.5 %-1:200000 IJ SOLN
INTRAMUSCULAR | Status: DC | PRN
  Administered 2021-08-31: 4 mL via EPIDURAL

## 2021-08-31 MED ORDER — OXYCODONE-ACETAMINOPHEN 5-325 MG PO TABS: 2.0000 | ORAL_TABLET | ORAL | Status: DC | PRN

## 2021-08-31 MED ORDER — OXYCODONE-ACETAMINOPHEN 5-325 MG PO TABS: 1.0000 | ORAL_TABLET | ORAL | Status: DC | PRN

## 2021-08-31 MED ORDER — SODIUM CHLORIDE 0.9 % IV SOLN
500.0000 mg | INTRAVENOUS | Status: DC
  Filled 2021-08-31 (×2): qty 5

## 2021-08-31 MED ORDER — FENTANYL-BUPIVACAINE-NACL 0.5-0.125-0.9 MG/250ML-% EP SOLN
EPIDURAL | Status: AC
  Filled 2021-08-31: qty 250

## 2021-08-31 MED ORDER — DIPHENHYDRAMINE HCL 50 MG/ML IJ SOLN: 12.5000 mg | INTRAMUSCULAR | Status: DC | PRN

## 2021-08-31 MED ORDER — GABAPENTIN 300 MG PO CAPS
300.0000 mg | ORAL_CAPSULE | ORAL | Status: AC
  Administered 2021-08-31: 300 mg via ORAL
  Filled 2021-08-31: qty 1

## 2021-08-31 MED ORDER — LABETALOL HCL 5 MG/ML IV SOLN: 20.0000 mg | INTRAVENOUS | Status: DC | PRN

## 2021-08-31 MED ORDER — LABETALOL HCL 200 MG PO TABS
200.0000 mg | ORAL_TABLET | Freq: Two times a day (BID) | ORAL | Status: DC
  Administered 2021-09-01 – 2021-09-02 (×3): 200 mg via ORAL
  Filled 2021-08-31 (×3): qty 1

## 2021-08-31 MED ORDER — AMMONIA AROMATIC IN INHA
RESPIRATORY_TRACT | Status: AC
  Filled 2021-08-31: qty 10

## 2021-08-31 MED ORDER — PHENYLEPHRINE HCL (PRESSORS) 10 MG/ML IV SOLN
INTRAVENOUS | Status: AC
  Filled 2021-08-31: qty 1

## 2021-08-31 MED ORDER — LACTATED RINGERS IV SOLN
500.0000 mL | INTRAVENOUS | Status: DC | PRN
  Administered 2021-08-31 (×3): 500 mL via INTRAVENOUS

## 2021-08-31 MED ORDER — MORPHINE SULFATE (PF) 0.5 MG/ML IJ SOLN
INTRAMUSCULAR | Status: AC
  Filled 2021-08-31: qty 10

## 2021-08-31 MED ORDER — FENTANYL CITRATE (PF) 100 MCG/2ML IJ SOLN
INTRAMUSCULAR | Status: DC | PRN
  Administered 2021-08-31: 10 ug via INTRATHECAL

## 2021-08-31 MED ORDER — LABETALOL HCL 5 MG/ML IV SOLN: 40.0000 mg | INTRAVENOUS | Status: DC | PRN

## 2021-08-31 MED ORDER — LACTATED RINGERS IV SOLN: INTRAVENOUS | Status: DC

## 2021-08-31 MED ORDER — LACTATED RINGERS IV SOLN: 500.0000 mL | Freq: Once | INTRAVENOUS | Status: DC

## 2021-08-31 MED ORDER — LIDOCAINE HCL (PF) 1 % IJ SOLN
INTRAMUSCULAR | Status: DC | PRN
  Administered 2021-08-31: 3 mL via SUBCUTANEOUS

## 2021-08-31 MED ORDER — SOD CITRATE-CITRIC ACID 500-334 MG/5ML PO SOLN: 30.0000 mL | ORAL | Status: DC | PRN

## 2021-08-31 MED ORDER — LABETALOL HCL 5 MG/ML IV SOLN: 80.0000 mg | INTRAVENOUS | Status: DC | PRN

## 2021-08-31 MED ORDER — ONDANSETRON HCL 4 MG/2ML IJ SOLN: 4.0000 mg | Freq: Four times a day (QID) | INTRAMUSCULAR | Status: DC | PRN

## 2021-08-31 MED ORDER — GABAPENTIN 300 MG PO CAPS: 300.0000 mg | ORAL_CAPSULE | ORAL | Status: DC

## 2021-08-31 MED ORDER — LIDOCAINE HCL (PF) 1 % IJ SOLN
INTRAMUSCULAR | Status: AC
  Filled 2021-08-31: qty 30

## 2021-08-31 MED ORDER — SODIUM CHLORIDE 0.9 % IV SOLN
INTRAVENOUS | Status: DC | PRN
  Administered 2021-08-31: 500 mg via INTRAVENOUS

## 2021-08-31 MED ORDER — SOD CITRATE-CITRIC ACID 500-334 MG/5ML PO SOLN
30.0000 mL | ORAL | Status: AC
  Administered 2021-08-31: 30 mL via ORAL

## 2021-08-31 MED ORDER — FENTANYL CITRATE (PF) 100 MCG/2ML IJ SOLN
50.0000 ug | INTRAMUSCULAR | Status: DC | PRN
  Administered 2021-08-31: 100 ug via INTRAVENOUS

## 2021-08-31 MED ORDER — OXYTOCIN-SODIUM CHLORIDE 30-0.9 UT/500ML-% IV SOLN
2.5000 [IU]/h | INTRAVENOUS | Status: DC
  Administered 2021-09-01: 2.5 [IU]/h via INTRAVENOUS
  Filled 2021-08-31: qty 500

## 2021-08-31 MED ORDER — ACETAMINOPHEN 500 MG PO TABS: 1000.0000 mg | ORAL_TABLET | ORAL | Status: DC

## 2021-08-31 MED ORDER — BUPIVACAINE HCL (PF) 0.25 % IJ SOLN
INTRAMUSCULAR | Status: DC | PRN
  Administered 2021-08-31: 3 mL via EPIDURAL
  Administered 2021-08-31: 5 mL via EPIDURAL

## 2021-08-31 MED ORDER — FENTANYL CITRATE (PF) 100 MCG/2ML IJ SOLN
INTRAMUSCULAR | Status: AC
  Filled 2021-08-31: qty 2

## 2021-08-31 MED ORDER — BUPIVACAINE IN DEXTROSE 0.75-8.25 % IT SOLN
INTRATHECAL | Status: DC | PRN
  Administered 2021-08-31: 1.5 mL via INTRATHECAL

## 2021-08-31 MED ORDER — OXYTOCIN 10 UNIT/ML IJ SOLN
INTRAMUSCULAR | Status: AC
  Filled 2021-08-31: qty 2

## 2021-08-31 MED ORDER — SOD CITRATE-CITRIC ACID 500-334 MG/5ML PO SOLN: 30.0000 mL | ORAL | Status: DC

## 2021-08-31 MED ORDER — MIDAZOLAM HCL 2 MG/2ML IJ SOLN
1.0000 mg | Freq: Once | INTRAMUSCULAR | Status: AC
  Administered 2021-08-31: 1 mg via INTRAVENOUS
  Filled 2021-08-31: qty 2

## 2021-08-31 MED ORDER — LIDOCAINE HCL (PF) 1 % IJ SOLN: 30.0000 mL | INTRAMUSCULAR | Status: DC | PRN

## 2021-08-31 MED ORDER — MISOPROSTOL 25 MCG QUARTER TABLET
25.0000 ug | ORAL_TABLET | ORAL | Status: DC | PRN
  Administered 2021-08-31: 25 ug via VAGINAL
  Filled 2021-08-31: qty 1

## 2021-08-31 MED ORDER — MORPHINE SULFATE (PF) 0.5 MG/ML IJ SOLN
INTRAMUSCULAR | Status: DC | PRN
  Administered 2021-08-31: .1 mg via INTRATHECAL

## 2021-08-31 MED ORDER — MISOPROSTOL 25 MCG QUARTER TABLET
ORAL_TABLET | ORAL | Status: AC
  Filled 2021-08-31: qty 1

## 2021-08-31 MED ORDER — BUPIVACAINE HCL (PF) 0.5 % IJ SOLN
INTRAMUSCULAR | Status: AC
  Administered 2021-09-01: 60 mL via INTRAMUSCULAR
  Filled 2021-08-31: qty 60

## 2021-08-31 MED ORDER — MICONAZOLE NITRATE 2 % EX CREA
TOPICAL_CREAM | Freq: Two times a day (BID) | CUTANEOUS | Status: DC
  Administered 2021-09-02: 1 via TOPICAL
  Filled 2021-08-31: qty 14

## 2021-08-31 MED ORDER — MISOPROSTOL 25 MCG QUARTER TABLET
25.0000 ug | ORAL_TABLET | Freq: Once | ORAL | Status: AC
  Administered 2021-08-31: 25 ug via ORAL
  Filled 2021-08-31: qty 1

## 2021-08-31 MED ORDER — MISOPROSTOL 200 MCG PO TABS
ORAL_TABLET | ORAL | Status: AC
  Filled 2021-08-31: qty 4

## 2021-08-31 MED ORDER — CEFAZOLIN IN SODIUM CHLORIDE 3-0.9 GM/100ML-% IV SOLN
3.0000 g | Freq: Once | INTRAVENOUS | Status: AC
  Administered 2021-08-31: 3 g via INTRAVENOUS
  Filled 2021-08-31: qty 100

## 2021-08-31 MED ORDER — FENTANYL-BUPIVACAINE-NACL 0.5-0.125-0.9 MG/250ML-% EP SOLN: 12.0000 mL/h | EPIDURAL | Status: DC | PRN

## 2021-08-31 MED ORDER — OXYTOCIN BOLUS FROM INFUSION: 333.0000 mL | Freq: Once | INTRAVENOUS | Status: DC

## 2021-08-31 MED ORDER — AZITHROMYCIN 500 MG PO TABS: 1000.0000 mg | ORAL_TABLET | Freq: Once | ORAL | Status: DC

## 2021-08-31 MED ORDER — MIDAZOLAM HCL 2 MG/2ML IJ SOLN
INTRAMUSCULAR | Status: AC
  Filled 2021-08-31: qty 2

## 2021-08-31 MED ORDER — POVIDONE-IODINE 10 % EX SWAB: 2.0000 | Freq: Once | CUTANEOUS | Status: DC

## 2021-08-31 MED ORDER — ACETAMINOPHEN 500 MG PO TABS
1000.0000 mg | ORAL_TABLET | ORAL | Status: AC
  Administered 2021-08-31: 1000 mg via ORAL
  Filled 2021-08-31: qty 2

## 2021-08-31 MED ORDER — OXYTOCIN-SODIUM CHLORIDE 30-0.9 UT/500ML-% IV SOLN
INTRAVENOUS | Status: DC | PRN
  Administered 2021-08-31: 1 mL via INTRAVENOUS
  Administered 2021-09-01: 299 mL via INTRAVENOUS

## 2021-08-31 SURGICAL SUPPLY — 30 items
BARRIER ADHS 3X4 INTERCEED (GAUZE/BANDAGES/DRESSINGS) ×2 IMPLANT
CHLORAPREP W/TINT 26 (MISCELLANEOUS) ×2 IMPLANT
DRSG TELFA 3X8 NADH (GAUZE/BANDAGES/DRESSINGS) ×2 IMPLANT
ELECT CAUTERY BLADE 6.4 (BLADE) ×2 IMPLANT
ELECT REM PT RETURN 9FT ADLT (ELECTROSURGICAL) ×2
ELECTRODE REM PT RTRN 9FT ADLT (ELECTROSURGICAL) ×1 IMPLANT
GAUZE SPONGE 4X4 12PLY STRL (GAUZE/BANDAGES/DRESSINGS) ×2 IMPLANT
GLOVE SURG SYN 8.0 (GLOVE) ×2 IMPLANT
GLOVE SURG SYN 8.0 PF PI (GLOVE) ×1 IMPLANT
GOWN STRL REUS W/ TWL LRG LVL3 (GOWN DISPOSABLE) ×2 IMPLANT
GOWN STRL REUS W/ TWL XL LVL3 (GOWN DISPOSABLE) ×1 IMPLANT
GOWN STRL REUS W/TWL LRG LVL3 (GOWN DISPOSABLE) ×2
GOWN STRL REUS W/TWL XL LVL3 (GOWN DISPOSABLE) ×1
MANIFOLD NEPTUNE II (INSTRUMENTS) ×2 IMPLANT
MAT PREVALON FULL STRYKER (MISCELLANEOUS) ×2 IMPLANT
NEEDLE HYPO 22GX1.5 SAFETY (NEEDLE) ×2 IMPLANT
NS IRRIG 1000ML POUR BTL (IV SOLUTION) ×2 IMPLANT
PACK C SECTION AR (MISCELLANEOUS) ×2 IMPLANT
PAD DRESSING TELFA 3X8 NADH (GAUZE/BANDAGES/DRESSINGS) ×1 IMPLANT
PAD OB MATERNITY 4.3X12.25 (PERSONAL CARE ITEMS) ×2 IMPLANT
PAD PREP 24X41 OB/GYN DISP (PERSONAL CARE ITEMS) ×2 IMPLANT
SCRUB CHG 4% DYNA-HEX 4OZ (MISCELLANEOUS) ×2 IMPLANT
STRAP SAFETY 5IN WIDE (MISCELLANEOUS) ×2 IMPLANT
SUCT VACUUM KIWI BELL (SUCTIONS) ×1 IMPLANT
SUT CHROMIC 1 CTX 36 (SUTURE) ×6 IMPLANT
SUT PLAIN GUT 0 (SUTURE) ×4 IMPLANT
SUT VIC AB 0 CT1 36 (SUTURE) ×4 IMPLANT
SYR 30ML LL (SYRINGE) ×4 IMPLANT
TRAP FLUID SMOKE EVACUATOR (MISCELLANEOUS) ×2 IMPLANT
WATER STERILE IRR 500ML POUR (IV SOLUTION) ×2 IMPLANT

## 2021-08-31 NOTE — Progress Notes (Signed)
Labor Progress Note  [name] is a 26 y.o. G2P1001 at [redacted]w[redacted]d by ultrasound admitted for induction of labor due to [facility].  Subjective: resting in bed after epidural placement, patient denies feeling pain with contractions  Objective: BP 119/73   Pulse (!) 104   Temp 97.7 F (36.5 C) (Oral)   Resp 18   Ht 5\' 9"  (1.753 m)   Wt 121.1 kg   BMI 39.43 kg/m  Notable VS details:   Fetal Assessment: FHT:  FHR: 135 bpm, variability: moderate,  accelerations:  Present,  decelerations:  Absent Category/reactivity:  Category I UC:   regular, every 3-4 minutes SVE:    Membrane status: AROM Amniotic color: scant, bloody show  Labs: Lab Results  Component Value Date   WBC 7.7 08/31/2021   HGB 11.6 (L) 08/31/2021   HCT 36.1 08/31/2021   MCV 75.2 (L) 08/31/2021   PLT 323 08/31/2021    Assessment / Plan: Induction of labor due to [facility],  progressing well on pitocin. Internal monitors placed with patient/FOB consent.  Labor: Progressing normally Preeclampsia:  labs stable, mild range BP noted, not treatable Fetal Wellbeing:  Category I Pain Control:  Epidural I/D:   GBS negative, AROM x  2 hrs Anticipated MOD:  NSVD  [name] [name], CNM 08/31/2021, 8:02 PM

## 2021-08-31 NOTE — Discharge Summary (Signed)
Obstetrical Discharge Summary  Patient Name: [name] DOB: 1995/07/24 MRN: [id number]  Date of Admission: 08/31/2021 Date of Delivery:08/31/21 at 58 Delivered by: Huel Cote MD Date of Discharge:09/02/2021   Primary OB: [place] LMP:12/26/21 inconsistent with 7.3 week [facility] Estimated Date of Delivery: 09/18/21 Gestational Age at Delivery: [redacted]w[redacted]d   Antepartum complications: as below  Admitting Diagnosis:  Secondary Diagnosis: Patient Active Problem List   Diagnosis Date Noted   Vaginal bleeding in pregnancy, third trimester 08/24/2021   Supervision of high risk pregnancy in third trimester 02/04/2021   NSVD (normal spontaneous vaginal delivery) 02/28/2019   Acute blood loss anemia 02/28/2019   Postpartum hemorrhage 02/27/2019   Chronic hypertension affecting pregnancy 02/26/2019   Encounter for antenatal screening for chromosomal anomalies    Family history of autism    Hyperemesis gravidarum 09/06/2018   UTI (urinary tract infection) 03/20/2017   Anxiety and depression 06/16/2016   Hypertension 02/01/2012   Insulin resistance 02/01/2012   Obesity 02/01/2012   Pancreatitis, acute 02/01/2012   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   Esophageal reflux    Proctalgia     Augmentation: Pitocin and Cytotec Complications: after 4 hours of no cervical change of no change past 4 cm , pt demands a c/s despite counseling  Intrapartum complications/course:  Date of Delivery:  Delivered By: Huel Cote MD Delivery Type: primary cesarean section, low transverse incision Anesthesia: spinal Placenta: spontaneous Laceration:  Episiotomy: none Newborn Data: Female apgars 8/9  Weight 6lbs 4.9 oz Postpartum Procedures:   Edinburgh:     09/02/2021   12:05 AM 03/01/2019   12:10 PM  Edinburgh Postnatal Depression Scale Screening Tool  I have been able to laugh and see the funny side of things. 0 0  I have looked forward with enjoyment to things. 2 0  I  have blamed myself unnecessarily when things went wrong. 1 2  I have been anxious or worried for no good reason. 2 2  I have felt scared or panicky for no good reason. 1 1  Things have been getting on top of me. 1 1  I have been so unhappy that I have had difficulty sleeping. 1 1  I have felt sad or miserable. 2 1  I have been so unhappy that I have been crying. 2 1  The thought of harming myself has occurred to me. 0 0  Edinburgh Postnatal Depression Scale Total 12 9    (Cesarean Section):  Patient had an uncomplicated postpartum course.  By time of discharge on POD#2, her pain was controlled on oral pain medications; she had appropriate lochia and was ambulating, voiding without difficulty, tolerating regular diet and passing flatus.   She was deemed stable for discharge to home.    Discharge Physical Exam:  BP 126/80 (BP Location: Right Arm)   Pulse (!) 114   Temp 98.2 F (36.8 C) (Oral)   Resp 20   Ht 5\' 9"  (1.753 m)   Wt 121.1 kg   SpO2 96%   Breastfeeding Unknown   BMI 39.43 kg/m   General: NAD CV: RRR Pulm: CTABL, nl effort ABD: s/nd/nt, fundus firm and below the umbilicus Lochia: moderate Incision: c/d/i DVT Evaluation: LE non-ttp, no evidence of DVT on exam.  Hemoglobin  Date Value Ref Range Status  09/01/2021 9.3 (L) 12.0 - 15.0 g/dL Final   HGB  Date Value Ref Range Status  01/08/2014 12.7 12.0 - 16.0 g/dL Final   HCT  Date Value Ref Range  Status  09/01/2021 29.2 (L) 36.0 - 46.0 % Final  01/08/2014 38.4 35.0 - 47.0 % Final     Disposition: stable, discharge to home. Baby Feeding: formula Baby Disposition: home with mom  Rh Immune globulin given:  Rubella vaccine given:  Tdap vaccine given in AP or PP setting:  Flu vaccine given in AP or PP setting:   Contraception: Vasectomy  Prenatal Labs:   Blood type/Rh A+  Antibody screen Negative    Rubella immune    Varicella Immune  RPR   NR  HBsAg  Neg  Hep C NR   HIV   NR  GC neg  Chlamydia  neg  Genetic screening cfDNA negative   1 hour GTT 106  3 hour GTT NA  GBS   Neg    Plan:  [name] was discharged to home in good condition. Follow-up appointment with delivering provider in 6 weeks.  Discharge Medications: Allergies as of 09/02/2021       Reactions   Estrogens Other (See Comments)   Hx clotting disorder        Medication List     STOP taking these medications    aspirin 81 MG chewable tablet   cephALEXin 500 MG capsule Commonly known as: KEFLEX   enoxaparin 40 MG/0.4ML injection Commonly known as: LOVENOX Replaced by: enoxaparin 40 MG/0.4ML injection   ferrous sulfate 325 (65 FE) MG tablet   prenatal multivitamin Tabs tablet       TAKE these medications    acetaminophen 500 MG tablet Commonly known as: TYLENOL Take 2 tablets (1,000 mg total) by mouth every 6 (six) hours. What changed:  when to take this reasons to take this   citalopram 20 MG tablet Commonly known as: CELEXA Take 20 mg by mouth daily.   coconut oil Oil Apply 1 Application topically as needed.   dibucaine 1 % Oint Commonly known as: NUPERCAINAL Place 1 Application rectally as needed for hemorrhoids.   enoxaparin 40 MG/0.4ML injection Commonly known as: LOVENOX Inject 0.4 mLs (40 mg total) into the skin daily. Start taking on: September 03, 2021 Replaces: enoxaparin 40 MG/0.4ML injection   famotidine 20 MG tablet Commonly known as: PEPCID Take 20 mg by mouth 2 (two) times daily.   ibuprofen 600 MG tablet Commonly known as: ADVIL Take 1 tablet (600 mg total) by mouth every 6 (six) hours.   labetalol 200 MG tablet Commonly known as: NORMODYNE Take 200 mg by mouth 2 (two) times daily. What changed: Another medication with the same name was added. Make sure you understand how and when to take each.   labetalol 200 MG tablet Commonly known as: NORMODYNE Take 1 tablet (200 mg total) by mouth 2 (two) times daily. What changed: You were already taking a  medication with the same name, and this prescription was added. Make sure you understand how and when to take each.   miconazole 2 % cream Commonly known as: MICOTIN Apply topically 2 (two) times daily.   oxyCODONE 5 MG immediate release tablet Commonly known as: Oxy IR/ROXICODONE Take 1-2 tablets (5-10 mg total) by mouth every 4 (four) hours as needed for moderate pain.   senna-docusate 8.6-50 MG tablet Commonly known as: Senokot-S Take 2 tablets by mouth daily. Start taking on: September 03, 2021   simethicone 80 MG chewable tablet Commonly known as: MYLICON Chew 1 tablet (80 mg total) by mouth as needed for flatulence.   witch hazel-glycerin pad Commonly known as: TUCKS Apply 1  Application topically as needed for hemorrhoids.         Follow-up Information     [facility] Follow up in 4 day(s).   Why: BP check Contact information: [address] [place] [address] [phone]        Schermerhorn, Ihor Austin, MD Follow up in 2 week(s).   Specialty: Obstetrics and Gynecology Why: postop wound check and mood check Contact information: [address] [place] [address] [phone]                 Signed: Chari Manning CNM

## 2021-08-31 NOTE — Anesthesia Preprocedure Evaluation (Signed)
Anesthesia Evaluation  Patient identified by MRN, date of birth, ID band Patient awake    Reviewed: Allergy & Precautions, H&P , NPO status , Patient's Chart, lab work & pertinent test results, reviewed documented beta blocker date and time   Airway Mallampati: II  TM Distance: >3 FB Neck ROM: full    Dental no notable dental hx. (+) Teeth Intact   Pulmonary former smoker,    Pulmonary exam normal breath sounds clear to auscultation       Cardiovascular Exercise Tolerance: Good hypertension, On Medications negative cardio ROS   Rhythm:regular Rate:Normal     Neuro/Psych negative neurological ROS  negative psych ROS   GI/Hepatic Neg liver ROS, GERD  Medicated,  Endo/Other  diabetes, Well Controlled, Gestational, Oral Hypoglycemic AgentsMorbid obesity  Renal/GU      Musculoskeletal   Abdominal   Peds  Hematology  (+) Blood dyscrasia, anemia ,   Anesthesia Other Findings   Reproductive/Obstetrics (+) Pregnancy                             Anesthesia Physical Anesthesia Plan  ASA: 3  Anesthesia Plan: Epidural   Post-op Pain Management:    Induction:   PONV Risk Score and Plan:   Airway Management Planned:   Additional Equipment:   Intra-op Plan:   Post-operative Plan:   Informed Consent: I have reviewed the patients History and Physical, chart, labs and discussed the procedure including the risks, benefits and alternatives for the proposed anesthesia with the patient or authorized representative who has indicated his/her understanding and acceptance.       Plan Discussed with:   Anesthesia Plan Comments:         Anesthesia Quick Evaluation

## 2021-08-31 NOTE — Anesthesia Procedure Notes (Signed)
Epidural Patient location during procedure: OB End time: 08/31/2021 7:40 PM  Staffing Performed: anesthesiologist   Preanesthetic Checklist Completed: patient identified, IV checked, site marked, risks and benefits discussed, surgical consent, monitors and equipment checked, pre-op evaluation and timeout performed  Epidural Patient position: sitting Prep: Betadine Patient monitoring: heart rate, continuous pulse ox and blood pressure Approach: midline Location: L4-L5 Injection technique: LOR saline  Needle:  Needle type: Tuohy  Needle gauge: 17 G Needle length: 9 cm and 9 Needle insertion depth: 6 cm Catheter type: closed end flexible Catheter size: 19 Gauge Catheter at skin depth: 12 cm Test dose: negative and 1.5% lidocaine with Epi 1:200 K  Assessment Events: blood not aspirated, injection not painful, no injection resistance, no paresthesia and negative IV test  Additional Notes   Patient tolerated the insertion well without complications.Reason for block:procedure for pain

## 2021-08-31 NOTE — Progress Notes (Signed)
Notified Dr Feliberto Gottron of patient's decision to proceed with a primary C-Section at this time. Provider in route.  Chari Manning CNM

## 2021-08-31 NOTE — H&P (Signed)
OB History & Physical   History of Present Illness:   Chief Complaint: IOL  HPI:  KASHINA MECUM is a 26 y.o. G2P1001 female at [redacted]w[redacted]d, 12/26/20 not consistent with [place] at [redacted]w[redacted]d, with Estimated Date of Delivery: 09/18/21.  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( Lovenox stopped after 1st trimester Anxiety and depression Obesity HX of PPH  Patient Active Problem List   Diagnosis Date Noted   Vaginal bleeding in pregnancy, third trimester 08/24/2021   NSVD (normal spontaneous vaginal delivery) 02/28/2019   Acute blood loss anemia 02/28/2019   Postpartum hemorrhage 02/27/2019   Chronic hypertension affecting pregnancy 02/26/2019   Encounter for antenatal screening for chromosomal anomalies    Family history of autism    Hyperemesis gravidarum 09/06/2018   UTI (urinary tract infection) 03/20/2017   Anxiety and depression 06/16/2016   Hypertension 02/01/2012   Insulin resistance 02/01/2012   Obesity 02/01/2012   Pancreatitis, acute 02/01/2012   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   Esophageal reflux    Proctalgia     Prenatal Transfer Tool  Maternal Diabetes: No Genetic Screening: Normal Maternal Ultrasounds/Referrals: Normal Fetal Ultrasounds or other Referrals:  Referred to Materal Fetal Medicine  Maternal Substance Abuse:  No Significant Maternal Medications:  Meds include: Other:  Significant Maternal Lab Results: Group B Strep negative  Maternal Medical History:   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   a.) Von Willebrand panel 02/23/2012 --> FVIII 400%; VWF Ag normal at 138%; consistent with hypercoagulable state   Esophageal reflux    Family  history of breast cancer 05/08/2013   HLD (hyperlipidemia)    Hyperemesis gravidarum    Hypertension 02/2011   Irregular menses 03/19/2012   Menorrhagia 2012   a.) menarche age 72; h/o irregular menses; menstrual cycles last 7 days; started Depo Provera injection in 10/2010, however self discontinued in 09/2011 due to 50# weight gain since starting   Mononucleosis 01/08/2014   [facility]   Palpitations    Pancreatitis 2012   Pre-diabetes 2014   Proctalgia    Intermittently   Prothrombin gene mutation (HCC) 10/2010   SOB (shortness of breath)    Vitamin D deficiency     Past Surgical History:  Procedure Laterality Date   WISDOM TOOTH EXTRACTION N/A 2013    Allergies  Allergen Reactions   Estrogens Other (See Comments)    Hx clotting disorder    Prior to Admission medications   Medication Sig Start Date End Date Taking? Authorizing Provider  acetaminophen (TYLENOL) 500 MG tablet Take 2 tablets (1,000 mg total) by mouth every 6 (six) hours as needed for mild pain or moderate pain. 03/01/19   [name], CNM  aspirin 81 MG chewable tablet Chew 81 mg by mouth daily.    [provider]  cephALEXin (KEFLEX) 500 MG capsule Take 1 capsule (500 mg total) by mouth 2 (two) times daily for 7 days. 08/25/21 09/01/21  McVey, Prudencio Pair, CNM  citalopram (CELEXA) 20 MG tablet Take 20 mg by mouth daily. Patient not taking: Reported on 07/29/2021    [provider]  enoxaparin (LOVENOX) 40 MG/0.4ML injection Inject 0.4 mLs (40 mg total) into the  skin at bedtime. Patient not taking: Reported on 07/29/2021 03/01/19   [name], CNM  famotidine (PEPCID) 20 MG tablet Take 20 mg by mouth 2 (two) times daily.    [provider]  ferrous sulfate 325 (65 FE) MG tablet Take 1 tablet (325 mg total) by mouth 2 (two) times daily with a meal. Patient not taking: Reported on 08/03/2021 03/01/19   [name], CNM  labetalol (NORMODYNE) 200 MG tablet Take 200 mg by mouth 2 (two)  times daily.    [provider]  Prenatal Vit-Fe Fumarate-FA (PRENATAL MULTIVITAMIN) TABS tablet Take 1 tablet by mouth daily at 12 noon.    [provider]     Prenatal care site:  [facility]  OB History  Gravida Para Term Preterm AB Living  0 0 1  SAB IAB Ectopic Multiple Live Births  0 0 0 0 1    # Outcome Date GA Lbr Len/2nd Weight Sex Delivery Anes PTL Lv  2 Current           1 Term 02/27/19 [redacted]w[redacted]d / 00:42 2950 g F Vag-Spont EPI, Local, Other  LIV     Name: Hauge,GIRL Mabel     Apgar1: 8  Apgar5: 9     Social History: She  reports that she quit smoking about 6 years ago. Her smoking use included cigarettes. She has never used smokeless tobacco. She reports that she does not currently use alcohol. She reports that she does not use drugs.  Family History: family history includes Breast cancer in her paternal aunt; Clotting disorder in her mother; Heart attack in her maternal grandfather; Heart disease in her maternal grandfather; Hypertension in her maternal grandmother; Kidney disease in her sister; Stroke in her maternal grandfather.   Review of Systems: A full review of systems was performed and negative except as noted in the HPI.     Physical Exam:  Vital Signs: BP (!) 149/94 (BP Location: Left Arm)   Pulse (!) 104   Temp 97.7 F (36.5 C) (Oral)   Resp 18   Ht  (1.753 m)   Wt 121.1 kg   BMI 39.43 kg/m   General: no acute distress.  HEENT: normocephalic, atraumatic Heart: regular rate & rhythm Lungs: normal respiratory effort Abdomen: soft, gravid, non-tender;  EFW: 6 lbs Pelvic:   External: Normal external female genitalia  Cervix: Dilation: 1.5 /   /      Extremities: non-tender, symmetric, no edema bilaterally.  DTRs: +2  Neurologic: Alert & oriented x 3.    Results for orders placed or performed during the hospital encounter of 08/31/21 (from the past 24 hour(s))  CBC     Status: Abnormal   Collection Time:  08/31/21 12:17 PM  Result Value Ref Range   WBC 7.7 4.0 - 10.5 K/uL   RBC 4.80 3.87 - 5.11 MIL/uL   Hemoglobin 11.6 (L) 12.0 - 15.0 g/dL   HCT 69.6 29.5 - 28.4 %   MCV 75.2 (L) 80.0 - 100.0 fL   MCH 24.2 (L) 26.0 - 34.0 pg   MCHC 32.1 30.0 - 36.0 g/dL   RDW 13.2 44.0 - 10.2 %   Platelets 323 150 - 400 K/uL   nRBC 0.0 0.0 - 0.2 %  Type and screen     Status: None (Preliminary result)   Collection Time: 08/31/21 12:17 PM  Result Value Ref Range   ABO/RH(D) PENDING    Antibody Screen PENDING  Sample Expiration      09/03/2021,2359 Performed at [facility], [address] [place], [place] [address]     Pertinent Results:  Prenatal Labs: Blood type/Rh A+  Antibody screen Negative    Rubella immune    Varicella Immune  RPR   NR  HBsAg  Neg  Hep C NR   HIV   NR  GC neg  Chlamydia neg  Genetic screening cfDNA negative   1 hour GTT 106  3 hour GTT NA  GBS   Neg   FHT:  FHR: 145 bpm, variability: moderate,  accelerations:  Present,  decelerations:  Absent Category/reactivity:  Category I UC:   irregular cramping   Cephalic by Leopolds and SVE   [place] MFM FETAL BPP WO NON STRESS  Result Date: 08/26/2021 ----------------------------------------------------------------------  OBSTETRICS REPORT                       (Signed Final 08/26/2021 06:45 pm) ---------------------------------------------------------------------- Patient Info  ID #:       [id number]                          D.O.B.:  1995/09/06 (26 yrs)  Name:       Jaci Lazier                   Visit Date: 08/26/2021 03:23 pm ---------------------------------------------------------------------- Performed By  Attending:        Ma Rings MD         Ref. Address:     [address]                                                             [place], [place],                                                             [place] [address]  Performed By:     Joanna Hews         [facility]                                     [facility] at                                                             [facility]  Referred By:      Christeen Douglas MD ---------------------------------------------------------------------- Orders  #  Description  Code        Ordered By  1  [place] MFM FETAL BPP WO NON               [id number]    YU FANG     STRESS ----------------------------------------------------------------------  #  Order #                     Accession #                Episode #  1  [id number]                   [id number]                 [id number] ---------------------------------------------------------------------- Indications  Hypertension - Chronic/Pre-existing            O10.019  Obesity complicating pregnancy, third          O99.213  trimester  [redacted] weeks gestation of pregnancy                Z3A.36 ---------------------------------------------------------------------- Fetal Evaluation  Num Of Fetuses:         1  Fetal Heart Rate(bpm):  148  Cardiac Activity:       Observed  Presentation:           Cephalic  Placenta:               Posterior  P. Cord Insertion:      Previously Visualized  Amniotic Fluid  AFI FV:      Within normal limits  AFI Sum(cm)     %Tile       Largest Pocket(cm)  9.67            21          4.13  RUQ(cm)       RLQ(cm)       LUQ(cm)        LLQ(cm)  2.34          3.2           0              4.13 ---------------------------------------------------------------------- Biophysical Evaluation  Amniotic F.V:   Within normal limits       F. Tone:        Observed  F. Movement:    Observed                   Score:          8/8  F. Breathing:   Observed ---------------------------------------------------------------------- Biometry  LV:        6.5  mm ---------------------------------------------------------------------- OB History  Gravidity:    2         Term:   1        Prem:   0        SAB:   0  TOP:          0       Ectopic:  0         Living: 1 ---------------------------------------------------------------------- Gestational Age  Best:          36w 5d     Det. By:  D.O. Conception          EDD:   09/18/21                                      (  12/26/20) ---------------------------------------------------------------------- Anatomy  Cranium:               Appears normal         Stomach:                Appears normal, left                                                                        sided  Cavum:                 Appears normal         Abdomen:                Appears normal  Ventricles:            Appears normal         Kidneys:                Appear normal  Heart:                 Appears normal         Bladder:                Appears normal                         (4CH, axis, and                         situs)  Diaphragm:             Appears normal  Other:  All anatomy has been previously visualized. ---------------------------------------------------------------------- Cervix Uterus Adnexa  Cervix  Not visualized (advanced GA >24wks)  Uterus  No abnormality visualized.  Right Ovary  Not visualized.  Left Ovary  Not visualized. ---------------------------------------------------------------------- Comments  This patient was seen for a BPP due to maternal obesity and  chronic hypertension treated with labetalol.  There was normal amniotic fluid noted today.  A BPP performed today was 8 out of 8.  She is already scheduled for delivery on August 31, 2021 (in 5  days).  No further exams were scheduled in our office. ----------------------------------------------------------------------                  Ma Rings, MD Electronically Signed Final Report   08/26/2021 06:45 pm ----------------------------------------------------------------------  [place] MFM OB FOLLOW UP  Result Date: 08/19/2021 ----------------------------------------------------------------------  OBSTETRICS REPORT                       (Signed Final 08/19/2021 01:48 pm)  ---------------------------------------------------------------------- Patient Info  ID #:       [id number]                          D.O.B.:  [birth year] (26 yrs)  Name:       LADREA HOLLADAY                   Visit Date: 08/19/2021 01:25 pm ---------------------------------------------------------------------- Performed By  Attending:        Ma Rings MD         Ref. Address:  [address]                                                             [address], [place],                                                             [place] [id number]  Performed By:     Joanna HewsAlexis Metcalf         Location[facility]         [facility]                                     F[facility] Care at                                                             [facility]  Referred By:      Christeen DouglasBETHANY                    BEASLEY MD ---------------------------------------------------------------------- Orders  #  Description                           Code        Ordered By  1  US MFM OB FOLLOW UP                   [id number]    Christeen DouglasBETHANY BEASLEY  2  US MFM FETAL BPP WO NON               76819.01    [facility]     STRESS ----------------------------------------------------------------------  #  Order #                     Accession #                Episode #  1  [id number]                   [phone]                 [id number]  2  [id number]                   [id number]                 [id number] ---------------------------------------------------------------------- Indications  Hypertension - Chronic/Pre-existing            O10.019  Obesity complicating pregnancy, third          O99.213  trimester  [redacted] weeks gestation of pregnancy                Z3A.35 ---------------------------------------------------------------------- Fetal Evaluation  Num Of Fetuses:         1  Fetal Heart Rate(bpm):  158  Cardiac Activity:       Observed  Presentation:           Cephalic  Placenta:               Fundal Left lateral  P. Cord Insertion:       Previously Visualized  Amniotic Fluid  AFI FV:      Within normal limits  AFI Sum(cm)     %Tile       Largest Pocket(cm)  9.82            20          5.33  RUQ(cm)       RLQ(cm)       LUQ(cm)        LLQ(cm)  1.92          2.57          0              5.33 ---------------------------------------------------------------------- Biophysical Evaluation  Amniotic F.V:   Within normal limits       F. Tone:        Observed  F. Movement:    Observed                   Score:          8/8  F. Breathing:   Observed ---------------------------------------------------------------------- Biometry  BPD:     89.31  mm     G. Age:  36w 1d         69  %    CI:        77.02   %    70 - 86                                                          FL/HC:      20.2   %    20.1 - 22.1  HC:    322.26   mm     G. Age:  36w 3d         34  %    HC/AC:      1.04        0.93 - 1.11  AC:    310.37   mm     G. Age:  35w 0d         37  %    FL/BPD:     72.8   %    71 - 87  FL:      64.98  mm     G. Age:  33w 4d        4.6  %    FL/AC:      20.9   %    20 - 24  LV:        7.4  mm  Est. FW:    2530  gm      5 lb 9 oz     26  % ---------------------------------------------------------------------- OB History  Gravidity:    2         Term:   1        Prem:   0  SAB:   0  TOP:          0       Ectopic:  0        Living: 1 ---------------------------------------------------------------------- Gestational Age  U/S Today:     35w 2d                                        EDD:   09/21/21  Best:          35w 5d     Det. By:  D.O. Conception          EDD:   09/18/21                                      (12/26/20) ---------------------------------------------------------------------- Anatomy  Cranium:               Appears normal         Stomach:                Appears normal, left                                                                        sided  Cavum:                 Appears normal         Abdomen:                Appears normal  Ventricles:             Appears normal         Kidneys:                Appear normal  Heart:                 Appears normal         Bladder:                Appears normal                         (4CH, axis, and                         situs)  Diaphragm:             Appears normal  Other:  All anatomy has been previously visualized. ---------------------------------------------------------------------- Cervix Uterus Adnexa  Cervix  Not visualized (advanced GA >24wks)  Uterus  No abnormality visualized.  Right Ovary  Not visualized.  Left Ovary  Not visualized. ---------------------------------------------------------------------- Comments  This patient was seen for a follow up growth scan and BPP  due to maternal obesity and chronic hypertension treated with  labetalol.  Her blood pressures today were 140/89 and  134/90.  The fetal growth and amniotic fluid level appears appropriate  for her gestational age.  A BPP performed today was 8 out of 8.  She will return in 1 week for another BPP.  She is already scheduled for  delivery August 31, 2021. ----------------------------------------------------------------------                   Ma Rings, MD Electronically Signed Final Report   08/19/2021 01:48 pm ----------------------------------------------------------------------  [place] MFM FETAL BPP WO NON STRESS  Result Date: 08/19/2021 ----------------------------------------------------------------------  OBSTETRICS REPORT                       (Signed Final 08/19/2021 01:48 pm) ---------------------------------------------------------------------- Patient Info  ID #:       [id number]                          D.O.B.:  10-10-1995 (26 yrs)  Name:       JALAYSHA SKILTON                   Visit Date: 08/19/2021 01:25 pm ---------------------------------------------------------------------- Performed By  Attending:        Ma Rings MD         Ref. Address:     [address]                                                             [place],  [place],                                                             [place] [address]  Performed By:     Joanna Hews         [facility]         [facility]                                     F[facility] Care at                                                             [facility]  Referred By:      Christeen Douglas MD ---------------------------------------------------------------------- Orders  #  Description                           Code        Ordered By  1  [place] MFM OB FOLLOW UP                   37858.85    BETHANY BEASLEY  2  [place] MFM FETAL BPP WO NON               76819.01    BETHANY BEASLEY  STRESS ----------------------------------------------------------------------  #  Order #                     Accession #                Episode #  1  [id number]                   [id number]                 [id number]  2  [id number]                   [id number]                 [id number] ---------------------------------------------------------------------- Indications  Hypertension - Chronic/Pre-existing            O10.019  Obesity complicating pregnancy, third          O99.213  trimester  [redacted] weeks gestation of pregnancy                Z3A.35 ---------------------------------------------------------------------- Fetal Evaluation  Num Of Fetuses:         1  Fetal Heart Rate(bpm):  158  Cardiac Activity:       Observed  Presentation:           Cephalic  Placenta:               Fundal Left lateral  P. Cord Insertion:      Previously Visualized  Amniotic Fluid  AFI FV:      Within normal limits  AFI Sum(cm)     %Tile       Largest Pocket(cm)  9.82            20          5.33  RUQ(cm)       RLQ(cm)       LUQ(cm)        LLQ(cm)  1.92          2.57          0              5.33 ---------------------------------------------------------------------- Biophysical Evaluation  Amniotic F.V:   Within normal limits       F. Tone:        Observed  F. Movement:    Observed                    Score:          8/8  F. Breathing:   Observed ---------------------------------------------------------------------- Biometry  BPD:     89.31  mm     G. Age:  36w 1d         69  %    CI:        77.02   %    70 - 86                                                          FL/HC:      20.2   %    20.1 - 22.1  HC:    322.26   mm     G. Age:  36w 3d         34  %    HC/AC:  1.04        0.93 - 1.11  AC:    310.37   mm     G. Age:  35w 0d         37  %    FL/BPD:     72.8   %    71 - 87  FL:      64.98  mm     G. Age:  33w 4d        4.6  %    FL/AC:      20.9   %    20 - 24  LV:        7.4  mm  Est. FW:    2530  gm      5 lb 9 oz     26  % ---------------------------------------------------------------------- OB History  Gravidity:    2         Term:   1        Prem:   0        SAB:   0  TOP:          0       Ectopic:  0        Living: 1 ---------------------------------------------------------------------- Gestational Age  U/S Today:     35w 2d                                        EDD:   09/21/21  Best:          35w 5d     Det. By:  D.O. Conception          EDD:   09/18/21                                      (12/26/20) ---------------------------------------------------------------------- Anatomy  Cranium:               Appears normal         Stomach:                Appears normal, left                                                                        sided  Cavum:                 Appears normal         Abdomen:                Appears normal  Ventricles:            Appears normal         Kidneys:                Appear normal  Heart:                 Appears normal         Bladder:                Appears normal                         (  4CH, axis, and                         situs)  Diaphragm:             Appears normal  Other:  All anatomy has been previously visualized. ---------------------------------------------------------------------- Cervix Uterus Adnexa  Cervix  Not visualized (advanced GA >24wks)  Uterus   No abnormality visualized.  Right Ovary  Not visualized.  Left Ovary  Not visualized. ---------------------------------------------------------------------- Comments  This patient was seen for a follow up growth scan and BPP  due to maternal obesity and chronic hypertension treated with  labetalol.  Her blood pressures today were 140/89 and  134/90.  The fetal growth and amniotic fluid level appears appropriate  for her gestational age.  A BPP performed today was 8 out of 8.  She will return in 1 week for another BPP.  She is already scheduled for delivery August 31, 2021. ----------------------------------------------------------------------                   Ma Rings, MD Electronically Signed Final Report   08/19/2021 01:48 pm ----------------------------------------------------------------------  [place] MFM FETAL BPP WO NON STRESS  Result Date: 08/12/2021 ----------------------------------------------------------------------  OBSTETRICS REPORT                       (Signed Final 08/12/2021 04:03 pm) ---------------------------------------------------------------------- Patient Info  ID #:       [id number]                          D.O.B.:  1995/03/30 (26 yrs)  Name:       MONAI HINDES                   Visit Date: 08/12/2021 02:21 pm ---------------------------------------------------------------------- Performed By  Attending:        Ma Rings MD         Ref. Address:     [address]                                                             [place], [place],                                                             [place] [address]  Performed By:     Joanna Hews         [facility]         [facility]                                     [facility] at  [facility]  Referred By:      Christeen Douglas MD ---------------------------------------------------------------------- Orders  #  Description                            Code        Ordered By  1  [place] MFM FETAL BPP WO NON               78469.62    YU FANG     STRESS ----------------------------------------------------------------------  #  Order #                     Accession #                Episode #  1  [id number]                   [id number]                 [id number] ---------------------------------------------------------------------- Indications  Hypertension - Chronic/Pre-                    O10.019  existing(Labetalol)  [redacted] weeks gestation of pregnancy                Z3A.34  History of congenital or genetic condition     Z87.798  (Increased Factor VIII Levels and  Prothrombin G Mutation)  Obesity complicating pregnancy (BMI 39)        O99.210 E66.9  LR-NIPS ---------------------------------------------------------------------- Vital Signs  BP:          145/99 ---------------------------------------------------------------------- Fetal Evaluation  Num Of Fetuses:         1  Fetal Heart Rate(bpm):  158  Cardiac Activity:       Observed  Presentation:           Cephalic  Placenta:               Left lateral  P. Cord Insertion:      Previously Visualized  Amniotic Fluid  AFI FV:      Within normal limits  AFI Sum(cm)     %Tile       Largest Pocket(cm)  13.23           44          6.17  RUQ(cm)       RLQ(cm)       LUQ(cm)        LLQ(cm)  6.17          2.2           2.8            2.06 ---------------------------------------------------------------------- Biophysical Evaluation  Amniotic F.V:   Within normal limits       F. Tone:        Observed  F. Movement:    Observed                   Score:          8/8  F. Breathing:   Observed ---------------------------------------------------------------------- Biometry  LV:        7.8  mm ---------------------------------------------------------------------- OB History  Gravidity:    2         Term:   1        Prem:   0  SAB:   0  TOP:          0       Ectopic:  0        Living: 1  ---------------------------------------------------------------------- Gestational Age  Best:          34w 5d     Det. By:  D.O. Conception          EDD:   09/18/21                                      (12/26/20) ---------------------------------------------------------------------- Anatomy  Cranium:               Appears normal         Stomach:                Appears normal, left                                                                        sided  Heart:                 Appears normal         Kidneys:                Appear normal                         (4CH, axis, and                         situs)  Diaphragm:             Appears normal         Bladder:                Appears normal  Other:  All anatomy has been previously visualized. ---------------------------------------------------------------------- Cervix Uterus Adnexa  Cervix  Not visualized (advanced GA >24wks)  Uterus  No abnormality visualized.  Right Ovary  Not visualized.  Left Ovary  Not visualized. ---------------------------------------------------------------------- Comments  This patient was seen for a BPP due to maternal obesity and  chronic hypertension treated with labetalol 100 mg twice a  day.  She denies any problems since her last exam.  The patient's blood pressures were noted to be elevated  today 155/103 and 145/99.  She denies any signs or  symptoms of preeclampsia.  A biophysical profile performed today was 8 out of 8.  There was normal amniotic fluid noted on today's ultrasound  exam.  Preeclampsia precautions were reviewed today.  Should her blood pressures continue to be elevated during  her prenatal visit tomorrow, consideration should be given to  increasing her labetalol dosage.  Should her blood pressures continue to be elevated, delivery  may be considered at around 37 weeks.  She will return in 1 week for another BPP. ----------------------------------------------------------------------                   Ma Rings,  MD Electronically Signed Final Report   08/12/2021 04:03 pm ----------------------------------------------------------------------  [place] MFM FETAL BPP WO NON STRESS  Result Date: 08/03/2021 ----------------------------------------------------------------------  OBSTETRICS  REPORT                       (Signed Final 08/03/2021 02:19 pm) ---------------------------------------------------------------------- Patient Info  ID #:       [id number]                          D.O.B.:  17-May-1995 (26 yrs)  Name:       SHENITA TREGO                   Visit Date: 08/03/2021 07:28 am ---------------------------------------------------------------------- Performed By  Attending:        Lin Landsman      Ref. Address:     [address]                    [address], [place],                                                             [place] [address]  Performed By:     Emeline Darling BS,      [facility]  Referred By:      Christeen Douglas MD ---------------------------------------------------------------------- Orders  #  Description                           Code        Ordered By  1  [place] MFM FETAL BPP WO NON               19147.82    CORENTHIAN     STRESS  BOOKER ----------------------------------------------------------------------  #  Order #                     Accession #                Episode #  1  [id number]                   [id number]                 [id number] ---------------------------------------------------------------------- Indications  Hypertension - Chronic/Pre-                    O10.019  existing(Labetalol)  Obesity complicating pregnancy (BMI 39)        O99.210  E66.9  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 Cord Insertion:      Previously Visualized  Amniotic Fluid  AFI FV:      Within normal limits  AFI Sum(cm)     %Tile       Largest Pocket(cm)  17.58           64          5.47  RUQ(cm)       RLQ(cm)       LUQ(cm)        LLQ(cm)  5.47          4.42          2.99           4.7 ---------------------------------------------------------------------- Biophysical Evaluation  Amniotic F.V:   Pocket => 2 cm             F. Tone:        Observed  F. Movement:    Observed                   Score:          8/8  F. Breathing:   Observed ---------------------------------------------------------------------- OB History  Gravidity:    2         Term:   1        Prem:   0        SAB:   0  TOP:          0       Ectopic:  0        Living: 1 ---------------------------------------------------------------------- Gestational Age  Best:          33w 3d     Det. By:  D.O. Conception          EDD:   09/18/21                                      (12/26/20) ---------------------------------------------------------------------- Anatomy  Ventricles:            Appears normal         Bladder:                Appears normal  Stomach:               Appears normal, left                         sided ---------------------------------------------------------------------- Impression  Antenatal testing performed given maternal chronic  hypertension on labetalol  The biophysical profile was 8/8 with good fetal movement and  amniotic  fluid volume. ---------------------------------------------------------------------- Recommendations  Continue weekly testing and serial growth as previously  scheduled. ----------------------------------------------------------------------              Lin Landsman, MD Electronically Signed Final Report   08/03/2021 02:19 pm ----------------------------------------------------------------------   Assessment:  DEKISHA MESMER is a 26 y.o. 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   Plan:  1. Admit to Labor & Delivery - consents reviewed and obtained -Pre-E labs ordered - monitor elevated BP -Conditional labetalol orders placed -Dr Feliberto Gottron aware of admission  2. Fetal Well being  - Fetal Tracing: Category 1 - Group B Streptococcus ppx not indicated: GBS negative - Presentation: Cephalic confirmed by SVE   3. Routine OB: - Prenatal labs reviewed, as above - Rh positive - CBC, T&S, RPR on admit - Clear liquid diet , continuous IV fluids  4. Induction of labor  - Contractions monitored with external toco - Pelvis proven to 6.8 lbs adequate for trial of labor  - Plan for induction with oxytocin and AROM  - Augmentation with oxytocin and AROM as appropriate  - Plan for  continuous fetal monitoring - Maternal pain control as desired; planning regional anesthesia, IVPM, nitrous oxide, and position changes  - Anticipate vaginal delivery  5. Post Partum Planning: - Infant feeding: formula feeding - Contraception: vasectomy - Tdap vaccine: Given prenatally - Flu vaccine:  not in season  Linton Stolp LUCY Delmar Landau, CNM 08/31/21 1:26 PM  Chari Manning, CNM Certified Nurse Midwife [facility]

## 2021-08-31 NOTE — Progress Notes (Signed)
Pt arrived for scheduled IOL. Denies feta concerns.

## 2021-08-31 NOTE — Progress Notes (Signed)
Pt now 4 cm for the last 4 hours  with adequate CTX , Pt is now demanding a primary LTCS despite my counseling that giving more time may lead to cervical advancement.   The risks of cesarean section discussed with the patient included but were not limited to: bleeding which may require transfusion or reoperation; infection which may require antibiotics; injury to bowel, bladder, ureters or other surrounding organs; injury to the fetus; need for additional procedures including hysterectomy in the event of a life-threatening hemorrhage; placental abnormalities wth subsequent pregnancies, incisional problems, thromboembolic phenomenon and other postoperative/anesthesia complications. The patient concurred with the proposed plan, giving informed written consent for the procedure.  . Anesthesia and OR aware. Preoperative prophylactic antibiotics and SCDs ordered on call to the OR.  To OR when ready.

## 2021-08-31 NOTE — Progress Notes (Signed)
Labor Progress Note  [name] is a 26 y.o. G2P1001 at [redacted]w[redacted]d by ultrasound admitted for induction of labor due to [facility].  Subjective: visibly upset and shaking, stating that she does not like the feeling of her legs being numb and not being able to move by herself d/t to the epidural. She is stating that she is exhausted and ready for this to be over and is requesting a c-section.   Objective: BP (!) 144/78 (BP Location: Left Arm)   Pulse (!) 106   Temp 98.7 F (37.1 C) (Oral)   Resp 16   Ht 5\' 9"  (1.753 m)   Wt 121.1 kg   SpO2 97%   BMI 39.43 kg/m  Notable VS details:   Fetal Assessment: FHT:  FHR: 135 bpm, variability: moderate,  accelerations:  Present,  decelerations:  Absent Category/reactivity:  Category I UC:   regular, every 2-3 minutes SVE:  4/70/-2  Membrane status: AROM Amniotic color: clear  Labs: Lab Results  Component Value Date   WBC 7.7 08/31/2021   HGB 11.6 (L) 08/31/2021   HCT 36.1 08/31/2021   MCV 75.2 (L) 08/31/2021   PLT 323 08/31/2021    Assessment / Plan: -Patient upset that she has not made more progress and is very anxious  and tearful requesting a c-section -Patient declined anxiety medication -notified Dr 10/31/2021 of patient's desire for primary c-section  Labor:  progressing normally on Pitocin Preeclampsia:  labs stable and BP stable Fetal Wellbeing:  Category I Pain Control:  Epidural I/D:   GBS neg, AROM x 5 hours Anticipated MOD:  NSVD vs C-Section  [name] [name], CNM 08/31/2021, 10:57 PM

## 2021-08-31 NOTE — Progress Notes (Signed)
Labor Progress Note  [name] is a 26 y.o. G2P1001 at [redacted]w[redacted]d by ultrasound admitted for induction of labor due to [facility].  Subjective: resting in bed anxious about pain and labor experience. 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 She rates her contractions 4/10.  Objective: BP 119/73   Pulse (!) 104   Temp 97.7 F (36.5 C) (Oral)   Resp 18   Ht 5\' 9"  (1.753 m)   Wt 121.1 kg   BMI 39.43 kg/m  Notable VS details:   Fetal Assessment: FHT:  FHR: 145 bpm, variability: moderate,  accelerations:  Present,  decelerations:  Absent Category/reactivity:  Category I UC:   irregular cramping SVE:  4/70/-2  Membrane status: AROM Amniotic color: scant bloody  Labs: Lab Results  Component Value Date   WBC 7.7 08/31/2021   HGB 11.6 (L) 08/31/2021   HCT 36.1 08/31/2021   MCV 75.2 (L) 08/31/2021   PLT 323 08/31/2021    Assessment / Plan: Induction of labor progressing normally with 1 dose of oral and vaginal Cytotec. Patient had some intermittent late decelerations corrected with fluid and position changes. Patient treated x 1 with Labetalol for severe range BP. Now WNL. Patient declined internal monitors at this time.  Labor:  Will start Pitocin after 20 minutes of a category 1 tracing Preeclampsia:  labs stable Fetal Wellbeing:  Category I Pain Control:  IV pain meds I/D:   GBS negative, AROM @1746  Anticipated MOD:  NSVD  Dr 10/31/2021 aware of labor events  Meylin Stenzel , CNM 08/31/2021, 6:17 PM

## 2021-09-01 ENCOUNTER — Encounter: Payer: Self-pay | Admitting: Obstetrics and Gynecology

## 2021-09-01 ENCOUNTER — Other Ambulatory Visit: Payer: Self-pay

## 2021-09-01 LAB — CBC
HCT: 29.2 % — ABNORMAL LOW (ref 36.0–46.0)
Hemoglobin: 9.3 g/dL — ABNORMAL LOW (ref 12.0–15.0)
MCH: 24.2 pg — ABNORMAL LOW (ref 26.0–34.0)
MCHC: 31.8 g/dL (ref 30.0–36.0)
MCV: 76 fL — ABNORMAL LOW (ref 80.0–100.0)
Platelets: 276 10*3/uL (ref 150–400)
RBC: 3.84 MIL/uL — ABNORMAL LOW (ref 3.87–5.11)
RDW: 14.7 % (ref 11.5–15.5)
WBC: 9.9 10*3/uL (ref 4.0–10.5)
nRBC: 0 % (ref 0.0–0.2)

## 2021-09-01 LAB — RPR: RPR Ser Ql: NONREACTIVE

## 2021-09-01 MED ORDER — KETOROLAC TROMETHAMINE 30 MG/ML IJ SOLN: 30.0000 mg | Freq: Four times a day (QID) | INTRAMUSCULAR | Status: DC | PRN

## 2021-09-01 MED ORDER — ONDANSETRON HCL 4 MG/2ML IJ SOLN: 4.0000 mg | Freq: Three times a day (TID) | INTRAMUSCULAR | Status: DC | PRN

## 2021-09-01 MED ORDER — SODIUM CHLORIDE 0.9% FLUSH: 3.0000 mL | INTRAVENOUS | Status: DC | PRN

## 2021-09-01 MED ORDER — DIPHENHYDRAMINE HCL 25 MG PO CAPS: 25.0000 mg | ORAL_CAPSULE | ORAL | Status: DC | PRN

## 2021-09-01 MED ORDER — SENNOSIDES-DOCUSATE SODIUM 8.6-50 MG PO TABS
2.0000 | ORAL_TABLET | Freq: Every day | ORAL | Status: DC
  Administered 2021-09-02: 2 via ORAL
  Filled 2021-09-01: qty 2

## 2021-09-01 MED ORDER — COCONUT OIL OIL: 1.0000 | TOPICAL_OIL | Status: DC | PRN

## 2021-09-01 MED ORDER — IBUPROFEN 600 MG PO TABS
600.0000 mg | ORAL_TABLET | Freq: Four times a day (QID) | ORAL | Status: DC
  Administered 2021-09-02 (×3): 600 mg via ORAL
  Filled 2021-09-01 (×4): qty 1

## 2021-09-01 MED ORDER — PHENYLEPHRINE HCL-NACL 20-0.9 MG/250ML-% IV SOLN
INTRAVENOUS | Status: DC | PRN
  Administered 2021-08-31: 15 ug/min via INTRAVENOUS

## 2021-09-01 MED ORDER — LABETALOL HCL 200 MG PO TABS
200.0000 mg | ORAL_TABLET | Freq: Two times a day (BID) | ORAL | Status: DC
  Filled 2021-09-01: qty 1

## 2021-09-01 MED ORDER — MENTHOL 3 MG MT LOZG: 1.0000 | LOZENGE | OROMUCOSAL | Status: DC | PRN

## 2021-09-01 MED ORDER — MORPHINE SULFATE (PF) 2 MG/ML IV SOLN: 1.0000 mg | INTRAVENOUS | Status: DC | PRN

## 2021-09-01 MED ORDER — DIBUCAINE (PERIANAL) 1 % EX OINT: 1.0000 | TOPICAL_OINTMENT | CUTANEOUS | Status: DC | PRN

## 2021-09-01 MED ORDER — WITCH HAZEL-GLYCERIN EX PADS: 1.0000 | MEDICATED_PAD | CUTANEOUS | Status: DC | PRN

## 2021-09-01 MED ORDER — PRENATAL MULTIVITAMIN CH
1.0000 | ORAL_TABLET | Freq: Every day | ORAL | Status: DC
  Administered 2021-09-01 – 2021-09-02 (×2): 1 via ORAL
  Filled 2021-09-01 (×2): qty 1

## 2021-09-01 MED ORDER — KETOROLAC TROMETHAMINE 30 MG/ML IJ SOLN
30.0000 mg | Freq: Four times a day (QID) | INTRAMUSCULAR | Status: AC
  Administered 2021-09-01 – 2021-09-02 (×4): 30 mg via INTRAVENOUS
  Filled 2021-09-01 (×4): qty 1

## 2021-09-01 MED ORDER — ACETAMINOPHEN 500 MG PO TABS
1000.0000 mg | ORAL_TABLET | Freq: Four times a day (QID) | ORAL | Status: DC
  Administered 2021-09-01 – 2021-09-02 (×7): 1000 mg via ORAL
  Filled 2021-09-01 (×9): qty 2

## 2021-09-01 MED ORDER — ENOXAPARIN SODIUM 40 MG/0.4ML IJ SOSY
40.0000 mg | PREFILLED_SYRINGE | INTRAMUSCULAR | Status: DC
  Administered 2021-09-02: 40 mg via SUBCUTANEOUS
  Filled 2021-09-01: qty 0.4

## 2021-09-01 MED ORDER — KETOROLAC TROMETHAMINE 30 MG/ML IJ SOLN
INTRAMUSCULAR | Status: DC | PRN
  Administered 2021-09-01: 30 mg via INTRAVENOUS

## 2021-09-01 MED ORDER — DIPHENHYDRAMINE HCL 25 MG PO CAPS: 25.0000 mg | ORAL_CAPSULE | Freq: Four times a day (QID) | ORAL | Status: DC | PRN

## 2021-09-01 MED ORDER — ACETAMINOPHEN 500 MG PO TABS: 1000.0000 mg | ORAL_TABLET | Freq: Four times a day (QID) | ORAL | Status: DC

## 2021-09-01 MED ORDER — OXYCODONE HCL 5 MG PO TABS
5.0000 mg | ORAL_TABLET | ORAL | Status: DC | PRN
  Administered 2021-09-02: 5 mg via ORAL
  Filled 2021-09-01: qty 1

## 2021-09-01 MED ORDER — DIPHENHYDRAMINE HCL 50 MG/ML IJ SOLN: 12.5000 mg | INTRAMUSCULAR | Status: DC | PRN

## 2021-09-01 MED ORDER — SIMETHICONE 80 MG PO CHEW: 80.0000 mg | CHEWABLE_TABLET | ORAL | Status: DC | PRN

## 2021-09-01 MED ORDER — NALOXONE HCL 4 MG/10ML IJ SOLN: 1.0000 ug/kg/h | INTRAVENOUS | Status: DC | PRN

## 2021-09-01 MED ORDER — OXYTOCIN-SODIUM CHLORIDE 30-0.9 UT/500ML-% IV SOLN
INTRAVENOUS | Status: AC
  Filled 2021-09-01: qty 500

## 2021-09-01 MED ORDER — ZOLPIDEM TARTRATE 5 MG PO TABS: 5.0000 mg | ORAL_TABLET | Freq: Every evening | ORAL | Status: DC | PRN

## 2021-09-01 MED ORDER — LACTATED RINGERS IV SOLN: INTRAVENOUS | Status: DC | PRN

## 2021-09-01 MED ORDER — OXYTOCIN-SODIUM CHLORIDE 30-0.9 UT/500ML-% IV SOLN: 2.5000 [IU]/h | INTRAVENOUS | Status: AC

## 2021-09-01 MED ORDER — MIDAZOLAM HCL 2 MG/2ML IJ SOLN
INTRAMUSCULAR | Status: DC | PRN
  Administered 2021-08-31: 1 mg via INTRAVENOUS

## 2021-09-01 MED ORDER — TETANUS-DIPHTH-ACELL PERTUSSIS 5-2.5-18.5 LF-MCG/0.5 IM SUSY: 0.5000 mL | PREFILLED_SYRINGE | Freq: Once | INTRAMUSCULAR | Status: DC

## 2021-09-01 MED ORDER — NALOXONE HCL 0.4 MG/ML IJ SOLN: 0.4000 mg | INTRAMUSCULAR | Status: DC | PRN

## 2021-09-01 MED ORDER — SIMETHICONE 80 MG PO CHEW
80.0000 mg | CHEWABLE_TABLET | Freq: Three times a day (TID) | ORAL | Status: DC
  Administered 2021-09-01 – 2021-09-02 (×5): 80 mg via ORAL
  Filled 2021-09-01 (×5): qty 1

## 2021-09-01 NOTE — Op Note (Signed)
NAMEBLISS, BEHNKE MEDICAL RECORD NO: [id number] ACCOUNT NO: [id number] DATE OF BIRTH: Apr 22, 1995 FACILITY: [facility] LOCATION: [facility] PHYSICIAN: Suzy Bouchard, MD  Operative Report   DATE OF PROCEDURE: 08/31/2021  PREOPERATIVE DIAGNOSES:   1.  37+3 weeks estimated gestational age. 2.  Chronic hypertension.  3.  The patient elects for primary cesarean section after trial of induction of labor not progressing past 4 cm.  POSTOPERATIVE DIAGNOSES:   1.  37+3 weeks estimated gestational age. 2.  Chronic hypertension.  3.  The patient elects for primary cesarean section after trial of induction of labor not progressing past 4 cm. 4.  Vigorous female, delivered.  PROCEDURE:  Primary low transverse cesarean section.  SURGEON:  Suzy Bouchard, MD  FIRST ASSISTANT: Chari Manning, certified nurse midwife.  ANESTHESIA:  Spinal.  INDICATIONS:  A 26 year old gravida 2, para 1, patient at 37+3 weeks estimated gestational age, brought in for induction of labor for chronic hypertension.  The patient underwent induction and progressed to 4 cm and after 4 hours of adequate contractions  and not progressing past 4 cm, the patient demands a primary low transverse cesarean section.  Counseling was done, stating that more time may be needed to have her advanced into a more definitive active labor, but the patient refuses and would like to  have a cesarean section.  DESCRIPTION OF PROCEDURE:  After adequate spinal anesthesia, the patient was placed in dorsal supine position with hip roll on the right side.  The patient's abdomen and vagina were prepped and draped in normal sterile fashion.  Timeout was performed.   The patient did receive 3 grams IV Ancef and 500 mg IV azithromycin for surgical prophylaxis.  A Pfannenstiel incision was made 2 fingerbreadths above the symphysis pubis.  Sharp dissection was used to identify the fascia.  Fascia was opened in the  midline and opened  in a transverse fashion.  The superior aspect of the fascia was grasped with Kocher clamps and the recti muscles were dissected free.  Inferior aspect of the fascia was grasped with Kocher clamps and pyramidalis muscle was dissected  free.  Entry into the peritoneal cavity was accomplished sharply.  Vesicouterine peritoneal fold was identified and bladder flap was created and the bladder was reflected inferiorly.  Low transverse uterine incision was made.  Upon entry into the  endometrial clavicle cavity, clear fluid resulted.  The transverse incision was extended with blunt transverse traction.  Fetal head was brought to the incision with some molding and caput.  Kiwi vacuum was then placed on the occiput and with 2 pulls,  the head was delivered and the vacuum was removed.  Shoulders and body were delivered without difficulty.  The patient was dried for the first 15 seconds and was not responding to stimulation; therefore, the cord was doubly clamped and female infant was  passed to nursery staff who assigned Apgar scores of 8 and 9.  Placenta was manually delivered and uterus was exteriorized.  Endometrial cavity was wiped clean with laparotomy tape and the uterine incision was closed with 1 chromic suture in a running  locking fashion, good approximation of edges.  Two additional figure-of-eight sutures were required for hemostasis.  Fallopian tubes and ovaries appeared normal.  Posterior cul-de-sac was irrigated and suctioned and the uterus was placed back in the  abdominal cavity.  Pericolic gutters were wiped clean with laparotomy tape and again, the uterine incision appeared hemostatic and Interceed was placed over the uterine incision in a  T-shaped fashion.  Fascia was then closed with an 0 Vicryl suture in a  running nonlocking fashion.  Two separate Vicryl sutures were used.  Fascial edges were injected with a solution of 60 mL of 0.5% Marcaine plus 20 mL normal saline, 45 mL of the solution used.   Subcutaneous tissue was irrigated and bovied for hemostasis  and given the depth of the subcutaneous tissues of 4.5 cm, the dead space was closed with a 2-0 chromic suture.  The skin was reapproximated with Insorb absorbable staples with good cosmetic effect.  There were no complications.  QUANTITATIVE BLOOD LOSS:  700 mL  INTRAOPERATIVE FLUIDS:  500 mL  URINE OUTPUT:  75 mL  The patient did receive 30 mg of Toradol at the end of the procedure, and was taken to recovery room in good condition.   VAI D: 09/01/2021 12:31:39 am T: 09/01/2021 12:49:00 am  JOB: [id number]/ [id number]

## 2021-09-01 NOTE — Brief Op Note (Signed)
08/31/2021 - 09/01/2021  12:14 AM  PATIENT:  [name]  26 y.o. female  PRE-OPERATIVE DIAGNOSIS:  Unscheduled Cesarean , elective primary  Section, See Delivery Summary  POST-OPERATIVE DIAGNOSIS:  same   Vigorous female  PROCEDURE:  Procedure(s): CESAREAN SECTION LTCS   SURGEON:  Surgeon(s) and Role:    * Hildreth Robart, Ihor Austin, MD - Primary  PHYSICIAN ASSISTANT: Rubye Oaks , CNM  ASSISTANTS: none   ANESTHESIA:   spinal  EBL:  qbl 700 cc  BLOOD ADMINISTERED:none  DRAINS: Urinary Catheter (Foley)   LOCAL MEDICATIONS USED:  MARCAINE     SPECIMEN:  No Specimen  DISPOSITION OF SPECIMEN:  N/A  COUNTS:  YES  TOURNIQUET:  * No tourniquets in log *  DICTATION: .Other Dictation: Dictation Number verbal   PLAN OF CARE: Admit to inpatient   PATIENT DISPOSITION:  PACU - hemodynamically stable.   Delay start of Pharmacological VTE agent (>24hrs) due to surgical blood loss or risk of bleeding: not applicable

## 2021-09-01 NOTE — Anesthesia Post-op Follow-up Note (Signed)
  Anesthesia Pain Follow-up Note  Patient: [name]  Day #: 1  Date of Follow-up: 09/01/2021 Time: 7:43 AM  Last Vitals:  Vitals:   09/01/21 0600 09/01/21 0727  BP:  131/85  Pulse: 100 (!) 102  Resp:  18  Temp:  36.8 C  SpO2: 93% 99%    Level of Consciousness: alert  Pain: none   Side Effects:None  Catheter Site Exam:clean, dry  Anti-Coag Meds (From admission, onward)   Start     Dose/Rate Route Frequency Ordered Stop   09/02/21 0000  enoxaparin (LOVENOX) injection 40 mg        40 mg Subcutaneous Every 24 hours 09/01/21 0330         Plan: D/C from anesthesia care at surgeon's request  Jeanine Luz

## 2021-09-01 NOTE — Anesthesia Postprocedure Evaluation (Signed)
Anesthesia Post Note  Patient: [name]  Procedure(s) Performed: CESAREAN SECTION  Patient location during evaluation: Mother Baby Anesthesia Type: Epidural and Spinal Level of consciousness: awake and alert Pain management: pain level controlled Vital Signs Assessment: post-procedure vital signs reviewed and stable Respiratory status: spontaneous breathing, nonlabored ventilation and respiratory function stable Cardiovascular status: stable Postop Assessment: no headache, no backache and epidural receding Anesthetic complications: no   No notable events documented.   Last Vitals:  Vitals:   09/01/21 0600 09/01/21 0727  BP:  131/85  Pulse: 100 (!) 102  Resp:  18  Temp:  36.8 C  SpO2: 93% 99%    Last Pain:  Vitals:   09/01/21 0727  TempSrc: Oral  PainSc:                  Jeanine Luz

## 2021-09-01 NOTE — Transfer of Care (Signed)
Immediate Anesthesia Transfer of Care Note  Patient: ARLYN BUMPUS  Procedure(s) Performed: CESAREAN SECTION  Patient Location: PACU  Anesthesia Type:Spinal  Level of Consciousness: awake, alert  and oriented  Airway & Oxygen Therapy: Patient Spontanous Breathing  Post-op Assessment: Report given to RN  Post vital signs: Reviewed and stable  Last Vitals:  Vitals Value Taken Time  BP    Temp    Pulse    Resp    SpO2      Last Pain:  Vitals:   08/31/21 2050  TempSrc: Oral  PainSc:       Patients Stated Pain Goal: 0 (08/31/21 1757)  Complications: No notable events documented.

## 2021-09-01 NOTE — Progress Notes (Signed)
Postop Day  1  Subjective: no complaints, voiding, and tolerating PO  Doing well, no concerns. Ambulating without difficulty, pain managed with PO meds, tolerating regular diet, and voiding without difficulty.   No fever/chills, chest pain, shortness of breath, nausea/vomiting, or leg pain. No nipple or breast pain.  No headache, visual changes, or RUQ/epigastric pain.  Objective: BP 120/77 (BP Location: Right Arm)   Pulse (!) 112   Temp 98 F (36.7 C) (Oral)   Resp 18   Ht 5\' 9"  (1.753 m)   Wt 121.1 kg   SpO2 99%   Breastfeeding Unknown   BMI 39.43 kg/m    Vitals:   08/31/21 1935 08/31/21 1940 08/31/21 1945 08/31/21 1950  BP: (!) 147/99 (!) 156/87 (!) 159/95 (!) 153/85   08/31/21 2005 08/31/21 2020 08/31/21 2035 08/31/21 2050  BP: (!) 152/93 (!) 146/86 (!) 110/49 (!) 144/78   09/01/21 0333 09/01/21 0426 09/01/21 0727 09/01/21 1149  BP: 131/86 138/86 131/85 120/77     Physical Exam:  General: alert, cooperative, and no distress Breasts: soft/nontender CV: RRR Pulm: nl effort, CTABL Abdomen: soft, non-tender, active bowel sounds Uterine Fundus: firm Incision: no significant drainage, covered with pressure dressing Perineum: minimal edema, intact Lochia: appropriate DVT Evaluation: No evidence of DVT seen on physical exam.  Recent Labs    08/31/21 1217 09/01/21 0512  HGB 11.6* 9.3*  HCT 36.1 29.2*  WBC 7.7 9.9  PLT 323 276    Assessment/Plan: 26 y.o. G2P2002 postpartum day # 1  -Continue routine postpartum care -Lactation consult PRN for breastfeeding   -Acute blood loss anemia - hemodynamically stable and asymptomatic; start PO ferrous sulfate BID with stool softeners  -Consider IV iron infusion tomorrow  -Immunization status:   all immunizations up to date -Blood pressures controlled with Labetalol 200mg  BID  -Plan to remove dressing tomorrow and replace with OP Site for 7 days  -Will start postpartum lovenox tonight   Disposition: Continue inpatient  postpartum care    LOS: 1 day   30, CNM 09/01/2021, 1:29 PM   ----- Gustavo Lah  Certified Nurse Midwife [facility]

## 2021-09-02 MED ORDER — ACETAMINOPHEN 500 MG PO TABS
1000.0000 mg | ORAL_TABLET | Freq: Four times a day (QID) | ORAL | 0 refills | Status: DC
Start: 2021-09-02 — End: 2023-03-29

## 2021-09-02 MED ORDER — SIMETHICONE 80 MG PO CHEW: 80.0000 mg | CHEWABLE_TABLET | ORAL | 0 refills | Status: DC | PRN

## 2021-09-02 MED ORDER — OXYCODONE HCL 5 MG PO TABS: 5.0000 mg | ORAL_TABLET | ORAL | 0 refills | Status: DC | PRN

## 2021-09-02 MED ORDER — WITCH HAZEL-GLYCERIN EX PADS: 1.0000 | MEDICATED_PAD | CUTANEOUS | 12 refills | Status: DC | PRN

## 2021-09-02 MED ORDER — IBUPROFEN 600 MG PO TABS
600.0000 mg | ORAL_TABLET | Freq: Four times a day (QID) | ORAL | 0 refills | Status: DC
Start: 2021-09-02 — End: 2022-10-03

## 2021-09-02 MED ORDER — COCONUT OIL OIL: 1.0000 | TOPICAL_OIL | 0 refills | Status: DC | PRN

## 2021-09-02 MED ORDER — ENOXAPARIN SODIUM 40 MG/0.4ML IJ SOSY: 40.0000 mg | PREFILLED_SYRINGE | INTRAMUSCULAR | 0 refills | Status: DC

## 2021-09-02 MED ORDER — MICONAZOLE NITRATE 2 % EX CREA: TOPICAL_CREAM | Freq: Two times a day (BID) | CUTANEOUS | 0 refills | Status: DC

## 2021-09-02 MED ORDER — DIBUCAINE (PERIANAL) 1 % EX OINT: 1.0000 | TOPICAL_OINTMENT | CUTANEOUS | Status: DC | PRN

## 2021-09-02 MED ORDER — SENNOSIDES-DOCUSATE SODIUM 8.6-50 MG PO TABS: 2.0000 | ORAL_TABLET | Freq: Every day | ORAL | Status: DC

## 2021-09-02 MED ORDER — LABETALOL HCL 200 MG PO TABS: 200.0000 mg | ORAL_TABLET | Freq: Two times a day (BID) | ORAL | 0 refills | Status: DC

## 2021-09-02 NOTE — Discharge Instructions (Signed)
Mental Health Resources for Therapy  [facility] [place]  Make an appointment: [phone]  [facility] - Located in [facility]  Make an appointment: [phone]  [facility] [place]  Make an appointment: [phone]  [address]  [place], [place] [address]  For additional local or virtual therapy appointment options, you can visit [email or web address] and filter by insurance and city to find local providers. Provider profiles include specialties to find the best fit for you!  

## 2021-09-02 NOTE — TOC Initial Note (Signed)
Transition of Care [facility]) - Initial/Assessment Note    Patient Details  Name: [name] MRN: [id number] Date of Birth: 04/29/1995  [facility]) CM/SW Contact:    Sierra Butcher, RN Phone Number: 09/02/2021, 11:03 AM  Clinical Narrative:                 [facility] consult received for [name].  RNCM reached out to the patient and was able to speak with her via phone, introduced self and explained reason for call.   Patient is from home with her significant other and father of infant [name] and her 74 1/2 year old daughter [name].  Infant is baby girl [name].  Patient reports that herself and infant are doing well, she is having some incisional pain today from the C section.  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  She has a prescription ready for her for the Celexa so she can start taking again.  Informed patient that information on Post Partum depression would be included with her discharge paperwork including information on Mental Health resources for therapy.  Pediatrician will be [facility], they have an appointment tomorrow at 1230 if they get discharged before then.  The home is prepared for the infant and they have all necessary items.          Patient Goals and CMS Choice        Expected Discharge Plan and Services           Expected Discharge Date: 09/02/21                                    Prior Living Arrangements/Services                       Activities of Daily Living Home Assistive Devices/Equipment: None ADL Screening (condition at time of admission) Patient's cognitive ability adequate to safely complete daily activities?: Yes Is the patient deaf or have difficulty hearing?: No Does the patient have difficulty seeing, even when wearing glasses/contacts?: No Does the patient have difficulty concentrating, remembering, or making decisions?:  No Patient able to express need for assistance with ADLs?: Yes Does the patient have difficulty dressing or bathing?: No Independently performs ADLs?: Yes (appropriate for developmental age) Communication: Independent Dressing (OT): Independent Grooming: Independent Feeding: Independent Bathing: Independent Toileting: Independent In/Out Bed: Independent Walks in Home: Independent Does the patient have difficulty walking or climbing stairs?: No Weakness of Legs: None Weakness of Arms/Hands: None  Permission Sought/Granted                  Emotional Assessment              Admission diagnosis:  Chronic hypertension affecting pregnancy [O10.919] Patient Active Problem List   Diagnosis Date Noted   Vaginal bleeding in pregnancy, third trimester 08/24/2021   Supervision of high risk pregnancy in third trimester 02/04/2021   NSVD (normal spontaneous vaginal delivery) 02/28/2019   Acute blood loss anemia 02/28/2019   Postpartum hemorrhage 02/27/2019   Chronic hypertension affecting pregnancy 02/26/2019   Encounter for antenatal screening for chromosomal anomalies    Family history of autism    Hyperemesis gravidarum 09/06/2018   UTI (urinary tract infection) 03/20/2017   Anxiety and depression 06/16/2016   Hypertension 02/01/2012   Insulin resistance 02/01/2012  Obesity 02/01/2012   Pancreatitis, acute 02/01/2012   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   Esophageal reflux    Proctalgia    PCP:  Sierra Douglas, MD Pharmacy:   [facility] [phone] [facility] - [place], [place] - [address] [place] [address] Phone: [phone] Fax: [phone]     Social Determinants of Health (SDOH) Interventions    Readmission Risk Interventions     No data to display

## 2021-09-02 NOTE — Progress Notes (Signed)
RN went over AVS documenting with patient. Patient verbalized understanding of  teaching and RN was able to answer any questions they had. Patients to be discharged at 2000 per patient request.

## 2021-09-02 NOTE — Progress Notes (Signed)
Patient d/c home with infant. D/c instructions, Rx, and f/u appt given to and reviewed with pt by previous shift RN. Pt verbalized understanding. Escorted out by tech via wheelchair.

## 2021-09-07 ENCOUNTER — Other Ambulatory Visit: Payer: Self-pay

## 2021-09-24 DIAGNOSIS — Z419 Encounter for procedure for purposes other than remedying health state, unspecified: Secondary | ICD-10-CM | POA: Diagnosis not present

## 2021-10-24 DIAGNOSIS — Z419 Encounter for procedure for purposes other than remedying health state, unspecified: Secondary | ICD-10-CM | POA: Diagnosis not present

## 2021-11-24 DIAGNOSIS — Z419 Encounter for procedure for purposes other than remedying health state, unspecified: Secondary | ICD-10-CM | POA: Diagnosis not present

## 2021-12-24 DIAGNOSIS — Z419 Encounter for procedure for purposes other than remedying health state, unspecified: Secondary | ICD-10-CM | POA: Diagnosis not present

## 2022-01-24 DIAGNOSIS — Z419 Encounter for procedure for purposes other than remedying health state, unspecified: Secondary | ICD-10-CM | POA: Diagnosis not present

## 2022-02-24 DIAGNOSIS — Z419 Encounter for procedure for purposes other than remedying health state, unspecified: Secondary | ICD-10-CM | POA: Diagnosis not present

## 2022-03-25 DIAGNOSIS — Z419 Encounter for procedure for purposes other than remedying health state, unspecified: Secondary | ICD-10-CM | POA: Diagnosis not present

## 2022-04-25 DIAGNOSIS — Z419 Encounter for procedure for purposes other than remedying health state, unspecified: Secondary | ICD-10-CM | POA: Diagnosis not present

## 2022-05-10 ENCOUNTER — Telehealth: Payer: Self-pay | Admitting: Obstetrics and Gynecology

## 2022-05-10 NOTE — Telephone Encounter (Signed)
LVM to call back to schedule apt with PCP. AS, CMA 

## 2022-05-25 DIAGNOSIS — Z419 Encounter for procedure for purposes other than remedying health state, unspecified: Secondary | ICD-10-CM | POA: Diagnosis not present

## 2022-06-25 DIAGNOSIS — Z419 Encounter for procedure for purposes other than remedying health state, unspecified: Secondary | ICD-10-CM | POA: Diagnosis not present

## 2022-07-25 DIAGNOSIS — Z419 Encounter for procedure for purposes other than remedying health state, unspecified: Secondary | ICD-10-CM | POA: Diagnosis not present

## 2022-08-02 DIAGNOSIS — L650 Telogen effluvium: Secondary | ICD-10-CM | POA: Diagnosis not present

## 2022-08-02 DIAGNOSIS — R300 Dysuria: Secondary | ICD-10-CM | POA: Diagnosis not present

## 2022-08-02 DIAGNOSIS — L659 Nonscarring hair loss, unspecified: Secondary | ICD-10-CM | POA: Diagnosis not present

## 2022-08-21 ENCOUNTER — Emergency Department
Admission: EM | Admit: 2022-08-21 | Discharge: 2022-08-21 | Disposition: A | Discharge status: Home / Self Care | Payer: Medicaid Other | Attending: Emergency Medicine | Admitting: Emergency Medicine

## 2022-08-21 ENCOUNTER — Other Ambulatory Visit: Payer: Self-pay

## 2022-08-21 ENCOUNTER — Emergency Department: Payer: Medicaid Other

## 2022-08-21 DIAGNOSIS — R1032 Left lower quadrant pain: Secondary | ICD-10-CM | POA: Diagnosis not present

## 2022-08-21 DIAGNOSIS — R000 Tachycardia, unspecified: Secondary | ICD-10-CM | POA: Diagnosis not present

## 2022-08-21 DIAGNOSIS — N3000 Acute cystitis without hematuria: Secondary | ICD-10-CM | POA: Diagnosis not present

## 2022-08-21 DIAGNOSIS — O2391 Unspecified genitourinary tract infection in pregnancy, first trimester: Secondary | ICD-10-CM | POA: Insufficient documentation

## 2022-08-21 DIAGNOSIS — O208 Other hemorrhage in early pregnancy: Secondary | ICD-10-CM | POA: Diagnosis not present

## 2022-08-21 DIAGNOSIS — O418X10 Other specified disorders of amniotic fluid and membranes, first trimester, not applicable or unspecified: Secondary | ICD-10-CM | POA: Insufficient documentation

## 2022-08-21 DIAGNOSIS — O2311 Infections of bladder in pregnancy, first trimester: Secondary | ICD-10-CM | POA: Diagnosis not present

## 2022-08-21 DIAGNOSIS — O209 Hemorrhage in early pregnancy, unspecified: Secondary | ICD-10-CM | POA: Diagnosis not present

## 2022-08-21 DIAGNOSIS — Z3490 Encounter for supervision of normal pregnancy, unspecified, unspecified trimester: Secondary | ICD-10-CM

## 2022-08-21 DIAGNOSIS — O26891 Other specified pregnancy related conditions, first trimester: Secondary | ICD-10-CM | POA: Diagnosis present

## 2022-08-21 DIAGNOSIS — Z3A01 Less than 8 weeks gestation of pregnancy: Secondary | ICD-10-CM | POA: Diagnosis not present

## 2022-08-21 DIAGNOSIS — R109 Unspecified abdominal pain: Secondary | ICD-10-CM | POA: Diagnosis not present

## 2022-08-21 LAB — CBC
HCT: 40.6 % (ref 36.0–46.0)
Hemoglobin: 13.5 g/dL (ref 12.0–15.0)
MCH: 25.8 pg — ABNORMAL LOW (ref 26.0–34.0)
MCHC: 33.3 g/dL (ref 30.0–36.0)
MCV: 77.5 fL — ABNORMAL LOW (ref 80.0–100.0)
Platelets: 343 10*3/uL (ref 150–400)
RBC: 5.24 MIL/uL — ABNORMAL HIGH (ref 3.87–5.11)
RDW: 13.9 % (ref 11.5–15.5)
WBC: 7.3 10*3/uL (ref 4.0–10.5)
nRBC: 0 % (ref 0.0–0.2)

## 2022-08-21 LAB — COMPREHENSIVE METABOLIC PANEL
ALT: 32 U/L (ref 0–44)
AST: 28 U/L (ref 15–41)
Albumin: 4 g/dL (ref 3.5–5.0)
Alkaline Phosphatase: 61 U/L (ref 38–126)
Anion gap: 9 (ref 5–15)
BUN: 9 mg/dL (ref 6–20)
CO2: 20 mmol/L — ABNORMAL LOW (ref 22–32)
Calcium: 9 mg/dL (ref 8.9–10.3)
Chloride: 107 mmol/L (ref 98–111)
Creatinine, Ser: 0.7 mg/dL (ref 0.44–1.00)
GFR, Estimated: >60 mL/min (ref >60)
Glucose, Bld: 124 mg/dL — ABNORMAL HIGH (ref 70–99)
Potassium: 3.5 mmol/L (ref 3.5–5.1)
Sodium: 136 mmol/L (ref 135–145)
Total Bilirubin: 0.5 mg/dL (ref 0.3–1.2)
Total Protein: 7.7 g/dL (ref 6.5–8.1)

## 2022-08-21 LAB — URINALYSIS, ROUTINE W REFLEX MICROSCOPIC
Bilirubin Urine: NEGATIVE
Glucose, UA: NEGATIVE mg/dL
Ketones, ur: NEGATIVE mg/dL
Nitrite: NEGATIVE
Protein, ur: 30 mg/dL — AB
Specific Gravity, Urine: 1.027 (ref 1.005–1.030)
pH: 5 (ref 5.0–8.0)

## 2022-08-21 LAB — HCG, QUANTITATIVE, PREGNANCY: hCG, Beta Chain, Quant, S: 18935 m[IU]/mL — ABNORMAL HIGH (ref <5)

## 2022-08-21 LAB — POC URINE PREG, ED: Preg Test, Ur: POSITIVE — AB

## 2022-08-21 LAB — LIPASE, BLOOD: Lipase: 40 U/L (ref 11–51)

## 2022-08-21 LAB — TROPONIN I (HIGH SENSITIVITY): Troponin I (High Sensitivity): 3 ng/L (ref <18)

## 2022-08-21 MED ORDER — NITROFURANTOIN MONOHYD MACRO 100 MG PO CAPS: 100.0000 mg | ORAL_CAPSULE | Freq: Two times a day (BID) | ORAL | 0 refills | Status: AC

## 2022-08-21 NOTE — Discharge Instructions (Addendum)
You were seen in the ER today for your abdominal pain and vaginal bleeding.  Your ultrasound showed that you do have a pregnancy in the correct location at about [redacted] weeks gestation.  It did show something called a subchorionic hemorrhage that I suspect is the cause of your vaginal bleeding.  I also sent a prescription for an antibiotic to your pharmacy for possible UTI.  Please keep your scheduled follow-up with OB/GYN for further evaluation.  Return to the ER for any new or worsening symptoms.

## 2022-08-21 NOTE — ED Triage Notes (Signed)
Pt arrives via POV d/t concerns for ectopic pregnancy . C/o left sided lower abdominal that radiates into the left lower back. Endorses spotting. LMP June 11th with a positive at home pregnancy text on July 9th. Also reports left sided shoulder pain without injury. Ongoing since. 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

## 2022-08-21 NOTE — ED Notes (Signed)
EDP at bedside  

## 2022-08-21 NOTE — ED Notes (Signed)
Pt resting comfortably in bed. Provided blanket to pt. Pending ultrasound results.

## 2022-08-21 NOTE — ED Provider Notes (Signed)
[facility] Provider Note    Event Date/Time   First MD Initiated Contact with Patient 08/21/22 [phone]     (approximate)   History   Abdominal Pain   HPI  [name] is a 27 y.o. female G4P2 presenting to the emergency department for evaluation of abdominal pain.  On July 9, patient had a positive home pregnancy test.  Her last menstrual period was on 6/11.  She presents with left lower abdominal pain that radiates into her left back.  Described as a constant dull pain, denies history of similar.  She has had some vaginal spotting, less bleeding than a period.  No vomiting or diarrhea.  Does report some ongoing dysuria and urinary frequency for few weeks.  She is concerned about a possible ectopic pregnancy.    Physical Exam   Triage Vital Signs: ED Triage Vitals  Encounter Vitals Group     BP 08/21/22 0458 (!) 158/11     Systolic BP Percentile --      Diastolic BP Percentile --      Pulse Rate 08/21/22 0458 (!) 115     Resp 08/21/22 0458 (!) 21     Temp 08/21/22 0458 98.3 F (36.8 C)     Temp Source 08/21/22 0458 Oral     SpO2 08/21/22 0458 97 %     Weight 08/21/22 0459 260 lb (117.9 kg)     Height 08/21/22 0459 5\' 9"  (1.753 m)     Head Circumference --      Peak Flow --      Pain Score 08/21/22 0459 6     Pain Loc --      Pain Education --      Exclude from Growth Chart --     Most recent vital signs: Vitals:   08/21/22 0458 08/21/22 0700  BP: (!) 158/11   Pulse: (!) 115 95  Resp: (!) 21   Temp: 98.3 F (36.8 C)   SpO2: 97% 99%     General: Awake, interactive  CV:  Tachycardic with regular rhythm, normal peripheral perfusion Resp:  Lungs clear, unlabored respirations.  Abd:  Soft, nondistended, tender to palpation in the left lower quadrant and left flank without rebound or guarding, remainder of abdomen nontender Neuro:  Symmetric facial movement, fluid speech   ED Results / Procedures / Treatments   Labs (all labs ordered  are listed, but only abnormal results are displayed) Labs Reviewed  COMPREHENSIVE METABOLIC PANEL - Abnormal; Notable for the following components:      Result Value   CO2 20 (*)    Glucose, Bld 124 (*)    All other components within normal limits  CBC - Abnormal; Notable for the following components:   RBC 5.24 (*)    MCV 77.5 (*)    MCH 25.8 (*)    All other components within normal limits  URINALYSIS, ROUTINE W REFLEX MICROSCOPIC - Abnormal; Notable for the following components:   Color, Urine YELLOW (*)    APPearance CLOUDY (*)    Hgb urine dipstick MODERATE (*)    Protein, ur 30 (*)    Leukocytes,Ua LARGE (*)    Bacteria, UA RARE (*)    All other components within normal limits  HCG, QUANTITATIVE, PREGNANCY - Abnormal; Notable for the following components:   hCG, Beta Chain, Quant, S 18,935 (*)    All other components within normal limits  POC URINE PREG, ED - Abnormal; Notable for the following components:  Preg Test, Ur POSITIVE (*)    All other components within normal limits  LIPASE, BLOOD  TROPONIN I (HIGH SENSITIVITY)     EKG EKG independently reviewed interpreted by myself (ER attending) demonstrates:  EKG demonstrates sinus tachycardia at a rate of 108, PR 140, QRS 80, no acute ST changes  RADIOLOGY Imaging independently reviewed and interpreted by myself demonstrates:  OB ultrasound demonstrates IUP, per radiology at estimated 6 weeks and 1 day gestation with a heart rate of 106 bpm.  Small subchorionic hemorrhage noted. Renal ultrasound without hydronephrosis  PROCEDURES:  Critical Care performed: No  Procedures   MEDICATIONS ORDERED IN ED: Medications - No data to display   IMPRESSION / MDM / ASSESSMENT AND PLAN / ED COURSE  I reviewed the triage vital signs and the nursing notes.  Differential diagnosis includes, but is not limited to, IUP, ectopic pregnancy, ruptured cyst, subchorionic hemorrhage, renal stone  Patient's presentation is most  consistent with acute presentation with potential threat to life or bodily function.  27 year old female presenting to the emergency department for evaluation of abdominal pain and vaginal bleeding.  Did review patient's Rh study from 2023, a positive, no indication for RhoGAM.  Lab work without severe derangement.  Beta hCG elevated at 18,000.  Urinalysis is somewhat concerning for infection with large leukocyte Estrace and 21-50 white blood cells with rare bacteria, though somewhat contaminated sample.  However with her urinary symptoms and in the setting of pregnancy, we will go ahead and treat this.  Ultrasound does demonstrate IUP with subchorionic hemorrhage, suspect that this is likely the cause of her vaginal spotting.  She does have a follow-up appointment with OB arranged.  Discussed results of workup with patient.  She is comfortable with discharge home.  Strict return precautions provided.  Patient discharged in stable condition.     FINAL CLINICAL IMPRESSION(S) / ED DIAGNOSES   Final diagnoses:  Intrauterine pregnancy  Left lower quadrant abdominal pain  Acute cystitis without hematuria  Subchorionic hemorrhage of placenta in first trimester     Rx / DC Orders   ED Discharge Orders          Ordered    nitrofurantoin, macrocrystal-monohydrate, (MACROBID) 100 MG capsule  2 times daily        08/21/22 0734             Note:  This document was prepared using Dragon voice recognition software and may include unintentional dictation errors.   Trinna Post, MD 08/21/22 [phone]

## 2022-08-25 DIAGNOSIS — Z419 Encounter for procedure for purposes other than remedying health state, unspecified: Secondary | ICD-10-CM | POA: Diagnosis not present

## 2022-08-31 DIAGNOSIS — D6852 Prothrombin gene mutation: Secondary | ICD-10-CM | POA: Diagnosis not present

## 2022-08-31 DIAGNOSIS — O3680X0 Pregnancy with inconclusive fetal viability, not applicable or unspecified: Secondary | ICD-10-CM | POA: Diagnosis not present

## 2022-08-31 DIAGNOSIS — O418X10 Other specified disorders of amniotic fluid and membranes, first trimester, not applicable or unspecified: Secondary | ICD-10-CM | POA: Diagnosis not present

## 2022-08-31 DIAGNOSIS — D66 Hereditary factor VIII deficiency: Secondary | ICD-10-CM | POA: Diagnosis not present

## 2022-08-31 DIAGNOSIS — O10919 Unspecified pre-existing hypertension complicating pregnancy, unspecified trimester: Secondary | ICD-10-CM | POA: Diagnosis not present

## 2022-08-31 DIAGNOSIS — O468X1 Other antepartum hemorrhage, first trimester: Secondary | ICD-10-CM | POA: Diagnosis not present

## 2022-08-31 DIAGNOSIS — N912 Amenorrhea, unspecified: Secondary | ICD-10-CM | POA: Diagnosis not present

## 2022-09-07 ENCOUNTER — Telehealth: Payer: Self-pay

## 2022-09-07 ENCOUNTER — Other Ambulatory Visit: Payer: Self-pay

## 2022-09-07 DIAGNOSIS — O10919 Unspecified pre-existing hypertension complicating pregnancy, unspecified trimester: Secondary | ICD-10-CM

## 2022-09-07 DIAGNOSIS — D6852 Prothrombin gene mutation: Secondary | ICD-10-CM

## 2022-09-07 DIAGNOSIS — D66 Hereditary factor VIII deficiency: Secondary | ICD-10-CM

## 2022-09-25 DIAGNOSIS — Z419 Encounter for procedure for purposes other than remedying health state, unspecified: Secondary | ICD-10-CM | POA: Diagnosis not present

## 2022-09-28 DIAGNOSIS — O0990 Supervision of high risk pregnancy, unspecified, unspecified trimester: Secondary | ICD-10-CM | POA: Insufficient documentation

## 2022-09-29 DIAGNOSIS — O10919 Unspecified pre-existing hypertension complicating pregnancy, unspecified trimester: Secondary | ICD-10-CM | POA: Diagnosis not present

## 2022-09-29 DIAGNOSIS — D6852 Prothrombin gene mutation: Secondary | ICD-10-CM | POA: Diagnosis not present

## 2022-09-29 DIAGNOSIS — E559 Vitamin D deficiency, unspecified: Secondary | ICD-10-CM | POA: Diagnosis not present

## 2022-09-29 DIAGNOSIS — O468X1 Other antepartum hemorrhage, first trimester: Secondary | ICD-10-CM | POA: Diagnosis not present

## 2022-09-29 DIAGNOSIS — O418X10 Other specified disorders of amniotic fluid and membranes, first trimester, not applicable or unspecified: Secondary | ICD-10-CM | POA: Diagnosis not present

## 2022-09-29 DIAGNOSIS — Z124 Encounter for screening for malignant neoplasm of cervix: Secondary | ICD-10-CM | POA: Diagnosis not present

## 2022-09-29 DIAGNOSIS — O99210 Obesity complicating pregnancy, unspecified trimester: Secondary | ICD-10-CM | POA: Diagnosis not present

## 2022-09-29 DIAGNOSIS — Z113 Encounter for screening for infections with a predominantly sexual mode of transmission: Secondary | ICD-10-CM | POA: Diagnosis not present

## 2022-09-29 DIAGNOSIS — D66 Hereditary factor VIII deficiency: Secondary | ICD-10-CM | POA: Diagnosis not present

## 2022-09-29 DIAGNOSIS — O0991 Supervision of high risk pregnancy, unspecified, first trimester: Secondary | ICD-10-CM | POA: Diagnosis not present

## 2022-09-29 DIAGNOSIS — Z131 Encounter for screening for diabetes mellitus: Secondary | ICD-10-CM | POA: Diagnosis not present

## 2022-09-30 DIAGNOSIS — Z131 Encounter for screening for diabetes mellitus: Secondary | ICD-10-CM | POA: Diagnosis not present

## 2022-09-30 DIAGNOSIS — R946 Abnormal results of thyroid function studies: Secondary | ICD-10-CM | POA: Diagnosis not present

## 2022-09-30 DIAGNOSIS — Z1329 Encounter for screening for other suspected endocrine disorder: Secondary | ICD-10-CM | POA: Diagnosis not present

## 2022-09-30 DIAGNOSIS — O99210 Obesity complicating pregnancy, unspecified trimester: Secondary | ICD-10-CM | POA: Diagnosis not present

## 2022-09-30 DIAGNOSIS — O0991 Supervision of high risk pregnancy, unspecified, first trimester: Secondary | ICD-10-CM | POA: Diagnosis not present

## 2022-09-30 DIAGNOSIS — O10919 Unspecified pre-existing hypertension complicating pregnancy, unspecified trimester: Secondary | ICD-10-CM | POA: Diagnosis not present

## 2022-09-30 DIAGNOSIS — E559 Vitamin D deficiency, unspecified: Secondary | ICD-10-CM | POA: Diagnosis not present

## 2022-09-30 LAB — OB RESULTS CONSOLE HEPATITIS B SURFACE ANTIGEN: Hepatitis B Surface Ag: NEGATIVE

## 2022-09-30 LAB — OB RESULTS CONSOLE HIV ANTIBODY (ROUTINE TESTING): HIV: NONREACTIVE

## 2022-09-30 LAB — OB RESULTS CONSOLE VARICELLA ZOSTER ANTIBODY, IGG: Varicella: NON-IMMUNE/NOT IMMUNE

## 2022-09-30 LAB — OB RESULTS CONSOLE RUBELLA ANTIBODY, IGM: Rubella: IMMUNE

## 2022-10-03 ENCOUNTER — Other Ambulatory Visit: Payer: Self-pay

## 2022-10-04 LAB — OB RESULTS CONSOLE GC/CHLAMYDIA
Chlamydia: NEGATIVE
Neisseria Gonorrhea: NEGATIVE

## 2022-10-05 ENCOUNTER — Ambulatory Visit: Payer: Medicaid Other | Admitting: Obstetrics

## 2022-10-05 ENCOUNTER — Ambulatory Visit: Payer: Medicaid Other

## 2022-10-05 ENCOUNTER — Other Ambulatory Visit: Payer: Self-pay

## 2022-10-05 DIAGNOSIS — O10919 Unspecified pre-existing hypertension complicating pregnancy, unspecified trimester: Secondary | ICD-10-CM

## 2022-10-05 DIAGNOSIS — E782 Mixed hyperlipidemia: Secondary | ICD-10-CM | POA: Insufficient documentation

## 2022-10-05 DIAGNOSIS — O99341 Other mental disorders complicating pregnancy, first trimester: Secondary | ICD-10-CM | POA: Diagnosis not present

## 2022-10-05 DIAGNOSIS — O99112 Other diseases of the blood and blood-forming organs and certain disorders involving the immune mechanism complicating pregnancy, second trimester: Secondary | ICD-10-CM

## 2022-10-05 DIAGNOSIS — E669 Obesity, unspecified: Secondary | ICD-10-CM | POA: Diagnosis not present

## 2022-10-05 DIAGNOSIS — D6859 Other primary thrombophilia: Secondary | ICD-10-CM | POA: Diagnosis not present

## 2022-10-05 DIAGNOSIS — D6852 Prothrombin gene mutation: Secondary | ICD-10-CM | POA: Diagnosis not present

## 2022-10-05 DIAGNOSIS — O99212 Obesity complicating pregnancy, second trimester: Secondary | ICD-10-CM

## 2022-10-05 DIAGNOSIS — O99211 Obesity complicating pregnancy, first trimester: Secondary | ICD-10-CM | POA: Insufficient documentation

## 2022-10-05 DIAGNOSIS — O99111 Other diseases of the blood and blood-forming organs and certain disorders involving the immune mechanism complicating pregnancy, first trimester: Secondary | ICD-10-CM

## 2022-10-05 DIAGNOSIS — O10012 Pre-existing essential hypertension complicating pregnancy, second trimester: Secondary | ICD-10-CM

## 2022-10-05 DIAGNOSIS — D66 Hereditary factor VIII deficiency: Secondary | ICD-10-CM | POA: Diagnosis not present

## 2022-10-05 DIAGNOSIS — O34219 Maternal care for unspecified type scar from previous cesarean delivery: Secondary | ICD-10-CM | POA: Diagnosis not present

## 2022-10-05 DIAGNOSIS — O10011 Pre-existing essential hypertension complicating pregnancy, first trimester: Secondary | ICD-10-CM | POA: Insufficient documentation

## 2022-10-05 DIAGNOSIS — O208 Other hemorrhage in early pregnancy: Secondary | ICD-10-CM | POA: Diagnosis not present

## 2022-10-05 DIAGNOSIS — Z3A13 13 weeks gestation of pregnancy: Secondary | ICD-10-CM

## 2022-10-05 DIAGNOSIS — F418 Other specified anxiety disorders: Secondary | ICD-10-CM | POA: Diagnosis not present

## 2022-10-05 DIAGNOSIS — E559 Vitamin D deficiency, unspecified: Secondary | ICD-10-CM | POA: Diagnosis not present

## 2022-10-05 DIAGNOSIS — O99281 Endocrine, nutritional and metabolic diseases complicating pregnancy, first trimester: Secondary | ICD-10-CM | POA: Diagnosis not present

## 2022-10-05 DIAGNOSIS — O10911 Unspecified pre-existing hypertension complicating pregnancy, first trimester: Secondary | ICD-10-CM

## 2022-10-05 NOTE — Progress Notes (Signed)
MFM Note  [name] was seen due to chronic hypertension treated with labetalol 200 mg twice a day and thrombophilia.   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  She denies any personal or family history of a thromboembolic event.    During her pregnancy last year, she was not treated with thromboprophylaxis during her pregnancy.  However,  she did receive thromboprophylaxis with Lovenox for 6 weeks postpartum following her cesarean delivery.  She denies denies any problems in her current pregnancy.  She denies any significant past surgical or social history.  She reports that she just had the cell free DNA test drawn to screen for fetal aneuploidy at the end of last week.  The results of that test are currently pending.  On today's exam, a viable singleton intrauterine gestation was noted with a crown-rump length that is consistent with an [facility] of April 11, 2023, making her 13 weeks and 1 day pregnant today.  An early evaluation of the fetal anatomy did not reveal any obvious fetal anomalies.  A nasal bone was present in the fetus.  The nuchal translucency measured 1.4 mm, which is within normal limits.  The following were discussed during today's consultation:  Thrombophilia and pregnancy/heterozygosity for prothrombin gene mutation  As she does not have a personal history of thrombosis, heterozygous carriers of the prothrombin gene mutation have less than 1% risk for a thromboembolic event during pregnancy.  Therefore, will not require thrombo-prophylaxis during her pregnancy.    However, should she be delivered via repeat C-section, prophylactic Lovenox may be given for 6 weeks postpartum.  Chronic hypertension in pregnancy  The implications and management of chronic hypertension in pregnancy was discussed.   The patient was advised to continue taking labetalol for treatment of high blood pressure  throughout her pregnancy.   The increased risk of superimposed preeclampsia, an indicated preterm delivery, and possible fetal growth restriction due to chronic hypertension in pregnancy was discussed.    She was advised to continue taking a daily baby aspirin for preeclampsia prophylaxis.    A detailed fetal anatomy scan has been scheduled for her in our office at around 19 weeks.    We will then follow her with growth ultrasounds throughout her pregnancy.    Weekly fetal testing should be started at around 32 weeks.   The patient was advised that delivery for chronic hypertension will usually be recommended at between 37 to 39 weeks depending on her blood pressure control and whether or not she develops preeclampsia.  The patient stated that all of her questions were answered today and she agrees with the management plan outlined above.  A total of 45 minutes was spent counseling and coordinating the care for this patient.  Greater than 50% of the time was spent in direct face-to-face contact.

## 2022-10-25 DIAGNOSIS — Z419 Encounter for procedure for purposes other than remedying health state, unspecified: Secondary | ICD-10-CM | POA: Diagnosis not present

## 2022-10-27 DIAGNOSIS — R7309 Other abnormal glucose: Secondary | ICD-10-CM | POA: Diagnosis present

## 2022-10-27 DIAGNOSIS — O99210 Obesity complicating pregnancy, unspecified trimester: Secondary | ICD-10-CM | POA: Diagnosis not present

## 2022-10-27 DIAGNOSIS — R748 Abnormal levels of other serum enzymes: Secondary | ICD-10-CM | POA: Diagnosis present

## 2022-11-01 DIAGNOSIS — R748 Abnormal levels of other serum enzymes: Secondary | ICD-10-CM | POA: Diagnosis not present

## 2022-11-01 DIAGNOSIS — R7309 Other abnormal glucose: Secondary | ICD-10-CM | POA: Diagnosis not present

## 2022-11-02 ENCOUNTER — Other Ambulatory Visit: Payer: Self-pay | Admitting: Obstetrics and Gynecology

## 2022-11-02 DIAGNOSIS — Z363 Encounter for antenatal screening for malformations: Secondary | ICD-10-CM

## 2022-11-02 DIAGNOSIS — O10012 Pre-existing essential hypertension complicating pregnancy, second trimester: Secondary | ICD-10-CM

## 2022-11-16 DIAGNOSIS — H5213 Myopia, bilateral: Secondary | ICD-10-CM | POA: Diagnosis not present

## 2022-11-23 ENCOUNTER — Ambulatory Visit: Payer: Medicaid Other | Attending: Maternal & Fetal Medicine

## 2022-11-23 ENCOUNTER — Other Ambulatory Visit: Payer: Self-pay

## 2022-11-23 DIAGNOSIS — Z363 Encounter for antenatal screening for malformations: Secondary | ICD-10-CM | POA: Diagnosis not present

## 2022-11-23 DIAGNOSIS — O99212 Obesity complicating pregnancy, second trimester: Secondary | ICD-10-CM

## 2022-11-23 DIAGNOSIS — O10012 Pre-existing essential hypertension complicating pregnancy, second trimester: Secondary | ICD-10-CM | POA: Diagnosis not present

## 2022-11-23 DIAGNOSIS — O4692 Antepartum hemorrhage, unspecified, second trimester: Secondary | ICD-10-CM

## 2022-11-23 DIAGNOSIS — E669 Obesity, unspecified: Secondary | ICD-10-CM

## 2022-11-23 DIAGNOSIS — O34219 Maternal care for unspecified type scar from previous cesarean delivery: Secondary | ICD-10-CM

## 2022-11-23 DIAGNOSIS — O09292 Supervision of pregnancy with other poor reproductive or obstetric history, second trimester: Secondary | ICD-10-CM

## 2022-11-23 DIAGNOSIS — Z3A20 20 weeks gestation of pregnancy: Secondary | ICD-10-CM | POA: Insufficient documentation

## 2022-11-23 DIAGNOSIS — D6852 Prothrombin gene mutation: Secondary | ICD-10-CM

## 2022-11-23 DIAGNOSIS — D6859 Other primary thrombophilia: Secondary | ICD-10-CM | POA: Diagnosis not present

## 2022-11-23 DIAGNOSIS — O99112 Other diseases of the blood and blood-forming organs and certain disorders involving the immune mechanism complicating pregnancy, second trimester: Secondary | ICD-10-CM | POA: Diagnosis not present

## 2022-11-24 ENCOUNTER — Other Ambulatory Visit: Payer: Self-pay | Admitting: Maternal & Fetal Medicine

## 2022-11-24 DIAGNOSIS — O10012 Pre-existing essential hypertension complicating pregnancy, second trimester: Secondary | ICD-10-CM

## 2022-11-25 DIAGNOSIS — Z419 Encounter for procedure for purposes other than remedying health state, unspecified: Secondary | ICD-10-CM | POA: Diagnosis not present

## 2022-12-01 DIAGNOSIS — O0992 Supervision of high risk pregnancy, unspecified, second trimester: Secondary | ICD-10-CM | POA: Diagnosis not present

## 2022-12-06 ENCOUNTER — Ambulatory Visit
Admission: EM | Admit: 2022-12-06 | Discharge: 2022-12-06 | Disposition: A | Discharge status: Home / Self Care | Payer: Medicaid Other | Attending: Emergency Medicine | Admitting: Emergency Medicine

## 2022-12-06 DIAGNOSIS — J029 Acute pharyngitis, unspecified: Secondary | ICD-10-CM

## 2022-12-06 DIAGNOSIS — J069 Acute upper respiratory infection, unspecified: Secondary | ICD-10-CM

## 2022-12-06 DIAGNOSIS — Z3A22 22 weeks gestation of pregnancy: Secondary | ICD-10-CM

## 2022-12-06 LAB — POCT RAPID STREP A (OFFICE): Rapid Strep A Screen: NEGATIVE

## 2022-12-06 NOTE — ED Triage Notes (Signed)
Patient to Urgent Care with complaints of sore throat. Denies any known fevers. Has had some nasal congestion/ cough/ ear fullness.   Reports symptoms started 6 days ago.  Has taken tylenol.

## 2022-12-06 NOTE — Discharge Instructions (Addendum)
Your strep test is negative.  Take Tylenol as needed for discomfort.  Follow-up with your OB/GYN.

## 2022-12-06 NOTE — ED Provider Notes (Signed)
Renaldo Fiddler    CSN: [id number] Arrival date & time: 12/06/22  0940      History   Chief Complaint Chief Complaint  Patient presents with   Sore Throat    HPI [name] is a 27 y.o. female.  Patient is [redacted] weeks pregnant.  She presents with sore throat x 6 days.  She also reports mild nasal congestion and mild cough.  Treating with Tylenol.  No fever, chest pain, shortness of breath, abdominal pain, or other symptoms.  The history is provided by the patient and medical records.    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   a.) Von Willebrand panel 02/23/2012 --> FVIII 400%; VWF Ag normal at 138%; consistent with hypercoagulable state   Esophageal reflux    Family history of breast cancer 05/08/2013   HLD (hyperlipidemia)    Hyperemesis gravidarum    Hypertension 02/2011   Irregular menses 03/19/2012   Menorrhagia 2012   a.) menarche age 83; h/o irregular menses; menstrual cycles last 7 days; started Depo Provera injection in 10/2010, however self discontinued in 09/2011 due to 50# weight gain since starting   Mononucleosis 01/08/2014   [facility]   Palpitations    Pancreatitis 2012   Pre-diabetes 2014   Proctalgia    Intermittently   Prothrombin gene mutation (HCC) 10/2010   SOB (shortness of breath)    Vitamin D deficiency     Patient Active Problem List   Diagnosis Date Noted   Vaginal bleeding in pregnancy, third trimester 08/24/2021   Supervision of high risk pregnancy in third trimester 02/04/2021   NSVD (normal spontaneous vaginal delivery) 02/28/2019   Acute blood loss anemia 02/28/2019   Postpartum hemorrhage 02/27/2019   Chronic hypertension affecting pregnancy 02/26/2019   Encounter for antenatal screening for chromosomal anomalies    Family history of autism    Hyperemesis gravidarum 09/06/2018   UTI (urinary tract infection)  03/20/2017   Anxiety and depression 06/16/2016   Hypertension 02/01/2012   Insulin resistance 02/01/2012   Obesity 02/01/2012   Pancreatitis, acute 02/01/2012   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   Esophageal reflux    Proctalgia     Past Surgical History:  Procedure Laterality Date   CESAREAN SECTION  08/31/2021   Procedure: CESAREAN SECTION;  Surgeon: Suzy Bouchard, MD;  Location: [facility] ORS;  Service: Obstetrics;;   WISDOM TOOTH EXTRACTION N/A 2013    OB History     Gravida  3   Para  2   Term  2   Preterm  0   AB  0   Living  2      SAB  0   IAB  0   Ectopic  0   Multiple  0   Live Births  2            Home Medications    Prior to Admission medications   Medication Sig Start Date End Date Taking? Authorizing Provider  acetaminophen (TYLENOL) 500 MG tablet Take 2 tablets (1,000 mg total) by mouth every 6 (six) hours. 09/02/21   Chari Manning, CNM  aspirin 81 MG chewable tablet Chew 81 mg by mouth daily.    [provider]  cholecalciferol (VITAMIN D3) 25 MCG (1000 UNIT) tablet Take 2,000 Units by mouth daily.    [provider]  labetalol (NORMODYNE) 200 MG tablet Take 200 mg by mouth 2 (two) times daily.    [provider]  Prenatal Vit-Fe Fumarate-FA (PRENATAL MULTIVITAMIN) TABS tablet Take 1 tablet by mouth daily at 12 noon.    [provider]    Family History Family History  Problem Relation Age of Onset   Clotting disorder Mother    Breast cancer Paternal Aunt    Hypertension Maternal Grandmother    Heart disease Maternal Grandfather    Heart attack Maternal Grandfather    Stroke Maternal Grandfather    Kidney disease Sister     Social History Social History   Tobacco Use   Smoking status: Former    Current packs/day: 0.00    Types: Cigarettes    Quit date: 05/25/2015    Years since quitting: 7.5   Smokeless tobacco: Never  Vaping Use   Vaping status: Former   Quit date:  05/25/2018  Substance Use Topics   Alcohol use: Not Currently   Drug use: No     Allergies   Estrogens   Review of Systems Review of Systems  Constitutional:  Negative for chills and fever.  HENT:  Positive for congestion, ear pain and sore throat.   Respiratory:  Positive for cough. Negative for shortness of breath.   Cardiovascular:  Negative for chest pain and palpitations.  Gastrointestinal:  Negative for abdominal pain, diarrhea and vomiting.  Skin:  Negative for color change and rash.     Physical Exam Triage Vital Signs ED Triage Vitals  Encounter Vitals Group     BP 12/06/22 1021 131/84     Systolic BP Percentile --      Diastolic BP Percentile --      Pulse Rate 12/06/22 1015 (!) 107     Resp 12/06/22 1015 18     Temp 12/06/22 1015 98.2 F (36.8 C)     Temp src --      SpO2 12/06/22 1015 98 %     Weight --      Height --      Head Circumference --      Peak Flow --      Pain Score 12/06/22 1018 3     Pain Loc --      Pain Education --      Exclude from Growth Chart --    No data found.  Updated Vital Signs BP 131/84   Pulse (!) 107   Temp 98.2 F (36.8 C)   Resp 18   LMP 07/05/2022   SpO2 98%   Visual Acuity Right Eye Distance:   Left Eye Distance:   Bilateral Distance:    Right Eye Near:   Left Eye Near:    Bilateral Near:     Physical Exam Constitutional:      General: She is not in acute distress. HENT:     Right Ear: Tympanic membrane normal.     Left Ear: Tympanic membrane normal.     Nose: Nose normal.     Mouth/Throat:     Mouth: Mucous membranes are moist.     Pharynx: Oropharynx is clear.     Comments: Clear PND. Cardiovascular:     Rate and Rhythm: Normal rate and regular rhythm.     Heart sounds: Normal heart sounds.  Pulmonary:     Effort: Pulmonary effort is normal. No respiratory distress.     Breath sounds: Normal breath sounds.  Skin:    General: Skin is warm and  dry.  Neurological:     Mental Status: She is  alert.      UC Treatments / Results  Labs (all labs ordered are listed, but only abnormal results are displayed) Labs Reviewed  POCT RAPID STREP A (OFFICE)    EKG   Radiology No results found.  Procedures Procedures (including critical care time)  Medications Ordered in UC Medications - No data to display  Initial Impression / Assessment and Plan / UC Course  I have reviewed the triage vital signs and the nursing notes.  Pertinent labs & imaging results that were available during my care of the patient were reviewed by me and considered in my medical decision making (see chart for details).    [redacted] weeks pregnant, viral pharyngitis, viral URI.  Patient's main symptom and concern is her sore throat.  Rapid strep negative.  Discussed symptomatic treatment including Tylenol.  Instructed her to follow-up with her OB/GYN.  Education provided on pharyngitis.  She agrees to plan of care.  Final Clinical Impressions(s) / UC Diagnoses   Final diagnoses:  [redacted] weeks gestation of pregnancy  Viral pharyngitis  Viral URI     Discharge Instructions      Your strep test is negative.  Take Tylenol as needed for discomfort.  Follow-up with your OB/GYN.     ED Prescriptions   None    PDMP not reviewed this encounter.   Mickie Bail, NP 12/06/22 1053

## 2022-12-21 ENCOUNTER — Other Ambulatory Visit: Payer: Self-pay | Admitting: Maternal & Fetal Medicine

## 2022-12-21 ENCOUNTER — Ambulatory Visit: Payer: Medicaid Other | Attending: Obstetrics and Gynecology

## 2022-12-21 ENCOUNTER — Other Ambulatory Visit: Payer: Self-pay

## 2022-12-21 DIAGNOSIS — Z3A24 24 weeks gestation of pregnancy: Secondary | ICD-10-CM | POA: Diagnosis not present

## 2022-12-21 DIAGNOSIS — O99212 Obesity complicating pregnancy, second trimester: Secondary | ICD-10-CM | POA: Diagnosis not present

## 2022-12-21 DIAGNOSIS — E669 Obesity, unspecified: Secondary | ICD-10-CM

## 2022-12-21 DIAGNOSIS — Z362 Encounter for other antenatal screening follow-up: Secondary | ICD-10-CM | POA: Insufficient documentation

## 2022-12-21 DIAGNOSIS — O4592 Premature separation of placenta, unspecified, second trimester: Secondary | ICD-10-CM

## 2022-12-21 DIAGNOSIS — O34219 Maternal care for unspecified type scar from previous cesarean delivery: Secondary | ICD-10-CM | POA: Diagnosis not present

## 2022-12-21 DIAGNOSIS — O10012 Pre-existing essential hypertension complicating pregnancy, second trimester: Secondary | ICD-10-CM | POA: Insufficient documentation

## 2022-12-25 DIAGNOSIS — Z419 Encounter for procedure for purposes other than remedying health state, unspecified: Secondary | ICD-10-CM | POA: Diagnosis not present

## 2023-01-04 ENCOUNTER — Other Ambulatory Visit: Payer: Self-pay | Admitting: Obstetrics

## 2023-01-04 DIAGNOSIS — O10012 Pre-existing essential hypertension complicating pregnancy, second trimester: Secondary | ICD-10-CM

## 2023-01-11 ENCOUNTER — Other Ambulatory Visit: Payer: Self-pay | Admitting: Maternal & Fetal Medicine

## 2023-01-11 ENCOUNTER — Ambulatory Visit: Payer: Medicaid Other | Attending: Maternal & Fetal Medicine

## 2023-01-11 DIAGNOSIS — O4592 Premature separation of placenta, unspecified, second trimester: Secondary | ICD-10-CM | POA: Diagnosis not present

## 2023-01-11 DIAGNOSIS — E669 Obesity, unspecified: Secondary | ICD-10-CM | POA: Diagnosis not present

## 2023-01-11 DIAGNOSIS — O99212 Obesity complicating pregnancy, second trimester: Secondary | ICD-10-CM | POA: Diagnosis not present

## 2023-01-11 DIAGNOSIS — O10913 Unspecified pre-existing hypertension complicating pregnancy, third trimester: Secondary | ICD-10-CM

## 2023-01-11 DIAGNOSIS — O321XX0 Maternal care for breech presentation, not applicable or unspecified: Secondary | ICD-10-CM | POA: Diagnosis not present

## 2023-01-11 DIAGNOSIS — O99112 Other diseases of the blood and blood-forming organs and certain disorders involving the immune mechanism complicating pregnancy, second trimester: Secondary | ICD-10-CM | POA: Diagnosis not present

## 2023-01-11 DIAGNOSIS — O34219 Maternal care for unspecified type scar from previous cesarean delivery: Secondary | ICD-10-CM

## 2023-01-11 DIAGNOSIS — O10012 Pre-existing essential hypertension complicating pregnancy, second trimester: Secondary | ICD-10-CM | POA: Diagnosis not present

## 2023-01-11 DIAGNOSIS — O10912 Unspecified pre-existing hypertension complicating pregnancy, second trimester: Secondary | ICD-10-CM | POA: Insufficient documentation

## 2023-01-11 DIAGNOSIS — Z3A27 27 weeks gestation of pregnancy: Secondary | ICD-10-CM | POA: Diagnosis not present

## 2023-01-16 DIAGNOSIS — Z3A27 27 weeks gestation of pregnancy: Secondary | ICD-10-CM | POA: Diagnosis not present

## 2023-01-16 DIAGNOSIS — B338 Other specified viral diseases: Secondary | ICD-10-CM | POA: Diagnosis not present

## 2023-01-16 DIAGNOSIS — J329 Chronic sinusitis, unspecified: Secondary | ICD-10-CM | POA: Diagnosis not present

## 2023-01-16 DIAGNOSIS — B9689 Other specified bacterial agents as the cause of diseases classified elsewhere: Secondary | ICD-10-CM | POA: Diagnosis not present

## 2023-01-16 DIAGNOSIS — Z03818 Encounter for observation for suspected exposure to other biological agents ruled out: Secondary | ICD-10-CM | POA: Diagnosis not present

## 2023-01-20 DIAGNOSIS — O0992 Supervision of high risk pregnancy, unspecified, second trimester: Secondary | ICD-10-CM | POA: Diagnosis not present

## 2023-01-25 DIAGNOSIS — Z419 Encounter for procedure for purposes other than remedying health state, unspecified: Secondary | ICD-10-CM | POA: Diagnosis not present

## 2023-02-13 ENCOUNTER — Ambulatory Visit: Payer: Medicaid Other

## 2023-02-15 ENCOUNTER — Other Ambulatory Visit: Payer: Self-pay

## 2023-02-15 ENCOUNTER — Other Ambulatory Visit: Payer: Self-pay | Admitting: Maternal & Fetal Medicine

## 2023-02-15 ENCOUNTER — Ambulatory Visit: Payer: Medicaid Other | Attending: Maternal & Fetal Medicine

## 2023-02-15 DIAGNOSIS — O10013 Pre-existing essential hypertension complicating pregnancy, third trimester: Secondary | ICD-10-CM

## 2023-02-15 DIAGNOSIS — E669 Obesity, unspecified: Secondary | ICD-10-CM

## 2023-02-15 DIAGNOSIS — O10913 Unspecified pre-existing hypertension complicating pregnancy, third trimester: Secondary | ICD-10-CM | POA: Diagnosis not present

## 2023-02-15 DIAGNOSIS — O4593 Premature separation of placenta, unspecified, third trimester: Secondary | ICD-10-CM | POA: Diagnosis not present

## 2023-02-15 DIAGNOSIS — Z3A32 32 weeks gestation of pregnancy: Secondary | ICD-10-CM | POA: Diagnosis not present

## 2023-02-15 DIAGNOSIS — O99213 Obesity complicating pregnancy, third trimester: Secondary | ICD-10-CM | POA: Diagnosis not present

## 2023-02-15 DIAGNOSIS — O34219 Maternal care for unspecified type scar from previous cesarean delivery: Secondary | ICD-10-CM

## 2023-02-20 ENCOUNTER — Ambulatory Visit: Payer: Medicaid Other

## 2023-02-25 DIAGNOSIS — Z419 Encounter for procedure for purposes other than remedying health state, unspecified: Secondary | ICD-10-CM | POA: Diagnosis not present

## 2023-03-01 ENCOUNTER — Ambulatory Visit: Payer: Medicaid Other

## 2023-03-03 DIAGNOSIS — O0993 Supervision of high risk pregnancy, unspecified, third trimester: Secondary | ICD-10-CM | POA: Diagnosis not present

## 2023-03-03 DIAGNOSIS — O10919 Unspecified pre-existing hypertension complicating pregnancy, unspecified trimester: Secondary | ICD-10-CM | POA: Diagnosis not present

## 2023-03-03 DIAGNOSIS — O99210 Obesity complicating pregnancy, unspecified trimester: Secondary | ICD-10-CM | POA: Diagnosis not present

## 2023-03-06 DIAGNOSIS — O0993 Supervision of high risk pregnancy, unspecified, third trimester: Secondary | ICD-10-CM | POA: Diagnosis not present

## 2023-03-06 DIAGNOSIS — O10913 Unspecified pre-existing hypertension complicating pregnancy, third trimester: Secondary | ICD-10-CM | POA: Diagnosis not present

## 2023-03-06 NOTE — H&P (Signed)
 [name] is a 28 y.o. female presenting for elective repeat LTCS on 03/27/23 EGA 37+6 weeks. 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   a.) Von Willebrand panel 02/23/2012 --> FVIII 400%; VWF Ag normal at 138%; consistent with hypercoagulable state   Esophageal reflux    Family history of breast cancer 05/08/2013   HLD (hyperlipidemia)    Hyperemesis gravidarum    Hypertension 02/2011   Irregular menses 03/19/2012   Menorrhagia 2012   a.) menarche age 68; h/o irregular menses; menstrual cycles last 7 days; started Depo Provera injection in 10/2010, however self discontinued in 09/2011 due to 50# weight gain since starting   Mononucleosis 01/08/2014   [facility]   Palpitations    Pancreatitis 2012   Pre-diabetes 2014   Proctalgia    Intermittently   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 Social History:  reports that she quit smoking about 7 years ago. Her smoking use included cigarettes. She has never used smokeless tobacco. She reports that she does not currently use alcohol. She reports that she does not use  drugs.  28 y.o. [id number] at  Patient's last menstrual period was 07/05/2022 (exact date). inconsistent with  ultrasound @ [redacted]w[redacted]d. 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 MFM referral - completed 9/11/20234 Recommendations:  Heterozygous carriers of prothrombin gene mutation have less than 1% risk for a thromboembolic event during pregnancy \ Thrombo-prophylaxis is NOT recommended in pregnancy  Recommend 6 weeks of lovenox  postpartum if she has a repeat c/section  Chronic HTN  Meds: Labetalol  200mg  BID Antepartum management:  Anatomy US  scheduled with MFM at 19 weeks  Monthly growth US  in 3rd trimester  Weekly NST/AFI 32 weeks  Weekly AFI/BPP with MFM 1/22,1/27,2/5,2/12 Twice weekly testing 34 weeks at [facility] Delivery 37-39 weeks dependent on blood pressure control On 81 mg ASA daily  Obesity in pregnancy  Labs: 1hr GTT: 149 A1C: 5.2 3hr GCT:  78,46,962,95 TSH: 0.408 CMP: see next problem Urine PCR: 80  Elevated liver enzymes  09/30/2022 - AST/ALT 92/75  11/01/22 - AST/ALT 21/14 Repeat with glucose test  Anxiety and depression  10/27/22 Increase in symptoms - consulting with [facility] on medications  Previous c/section - desires repeat with TJS  Postpartum 6 weeks Lovenox   Low Vitamin D: 14.8 Started on daily supplements  Recheck postpartum  SGA   MFM u/s 12/21/22:Est. FW:     575  gm      1 lb 4 oz     10  % , AC : 64% 02/15/23 MFM: EFW 1719 grams, 15%tile, AC 34% tile  Repeat growth to be scheduled with [facility] (per TJS does not need to go to MFM anymore)  Screening results and needs: NOB:  Medicaid Questionnaire: done []  ACHD Program Depression Score: 6 MBT:  A + Ab screen:   Negative HIV:  Negative RPR:   NR Hep B: Negative Hep C: NR Pap: 09/29/2022 NILM G/C: neg/neg  Rubella:  Immune  VZV:  Immune TSH: 0.408 HgA1C:  5.2 Aneuploidy:  First trimester:  MaternitT21: negative  Second trimester (AFP/tetra): negative 28 weeks:  Review Medicaid Questionnaire:no changes []  ACHD Program  Depression Score:6 Blood consent:signed [facility] 01/20/23 Hgb: 10.9  Platelets: 310   Glucola: 107  RPR: NR   Rhogam: n/a 36 weeks:  GBS:   G/C:   Hgb:  Platelets:    HIV:  Last US :  08/31/2022: Single, viable IUP seen; CRL = 14.8 mm = [redacted]w[redacted]d; EDC by US  04/13/2022; FHT: 167 bpm ; Cervical length: 5.14 cm; Right ovary: WNL; Left ovary: CLC measuring 27 mm; Small SCH seen measuring 1.70 x 1.39 x 1.49 cm 09/29/2022:Single, viable IUP seen; CRL = 54.7 mm = [redacted]w[redacted]d; EDC by US  04/13/2023; FHT: 175 bpm; Cervical length 4.68 cm; Right ovary WNL; Left ovary: CLC measuring 24mm 11/23/22: MFM anatomy: [redacted]w[redacted]d, presentation variable, placente left lateral, AFI normal, cervical length 4cm, will need a follow up anatomy u/s 12/21/22:MFM follow up anatomy: [redacted]w[redacted]d, transverse, post plac, AFI-WNL, cervical length- 3.4 cm, EFW-575 g @ 10%, follow up anatomy in 3 weeks-01/11/23 01/11/23:MFM-breech, post plac, EFW-985 g @25 %, AFI-WNL 02/15/2023: MFM; BPP 8/8, AFI 7.68cm, MVP 2.45cm; EFW 1719 grams, 15%tile, AC 34%tile; cephalic presentation, posterior placenta  03/06/23: vertex, fundal plac, AFI-11.8 cm Immunization:   Flu in season - declined [facility] 12/27/22 Tdap at 27-36 weeks - declined today 02/28/23 but will decide by next week's visit RSV at 32-36 weeks - out of season to receive     Review of Systems History   Last menstrual period 07/05/2022, unknown if currently breastfeeding. Exam2/4/25 Physical Exam  BP115/69  Lungs CTA   CV RRR Abd gravid  Prenatal labs: ABO, Rh:  A+ Antibody:  neg Rubella:  Imm , VZ : Imm RPR:   NR HBsAg:   neg Hep C: NR HIV:   neg GBS:   pending  Assessment/Plan: CHTN on meds  with prior c/s elects for repeat c/s 03/27/23 Risk discussed with pt . See [facility] notes  .    [name] 03/06/2023, 6:17 PM

## 2023-03-07 DIAGNOSIS — O10913 Unspecified pre-existing hypertension complicating pregnancy, third trimester: Secondary | ICD-10-CM | POA: Diagnosis not present

## 2023-03-07 DIAGNOSIS — O99213 Obesity complicating pregnancy, third trimester: Secondary | ICD-10-CM | POA: Diagnosis not present

## 2023-03-07 DIAGNOSIS — O0993 Supervision of high risk pregnancy, unspecified, third trimester: Secondary | ICD-10-CM | POA: Diagnosis not present

## 2023-03-08 ENCOUNTER — Ambulatory Visit: Payer: Medicaid Other

## 2023-03-10 DIAGNOSIS — O0993 Supervision of high risk pregnancy, unspecified, third trimester: Secondary | ICD-10-CM | POA: Diagnosis not present

## 2023-03-10 DIAGNOSIS — O10913 Unspecified pre-existing hypertension complicating pregnancy, third trimester: Secondary | ICD-10-CM | POA: Diagnosis not present

## 2023-03-14 DIAGNOSIS — O10919 Unspecified pre-existing hypertension complicating pregnancy, unspecified trimester: Secondary | ICD-10-CM | POA: Diagnosis not present

## 2023-03-14 DIAGNOSIS — D6852 Prothrombin gene mutation: Secondary | ICD-10-CM | POA: Diagnosis not present

## 2023-03-14 DIAGNOSIS — O99210 Obesity complicating pregnancy, unspecified trimester: Secondary | ICD-10-CM | POA: Diagnosis not present

## 2023-03-14 DIAGNOSIS — O0993 Supervision of high risk pregnancy, unspecified, third trimester: Secondary | ICD-10-CM | POA: Diagnosis not present

## 2023-03-14 DIAGNOSIS — O34219 Maternal care for unspecified type scar from previous cesarean delivery: Secondary | ICD-10-CM | POA: Diagnosis not present

## 2023-03-15 ENCOUNTER — Other Ambulatory Visit: Payer: Medicaid Other

## 2023-03-16 LAB — OB RESULTS CONSOLE GBS: GBS: NEGATIVE

## 2023-03-17 ENCOUNTER — Observation Stay
Admission: EM | Admit: 2023-03-17 | Discharge: 2023-03-17 | Disposition: A | Discharge status: Home / Self Care | Payer: Medicaid Other | Attending: Certified Nurse Midwife | Admitting: Certified Nurse Midwife

## 2023-03-17 ENCOUNTER — Encounter: Payer: Self-pay | Admitting: Obstetrics and Gynecology

## 2023-03-17 ENCOUNTER — Observation Stay: Payer: Medicaid Other

## 2023-03-17 ENCOUNTER — Other Ambulatory Visit: Payer: Self-pay

## 2023-03-17 DIAGNOSIS — Z3A36 36 weeks gestation of pregnancy: Secondary | ICD-10-CM | POA: Diagnosis not present

## 2023-03-17 DIAGNOSIS — Z79899 Other long term (current) drug therapy: Secondary | ICD-10-CM | POA: Insufficient documentation

## 2023-03-17 DIAGNOSIS — O368330 Maternal care for abnormalities of the fetal heart rate or rhythm, third trimester, not applicable or unspecified: Secondary | ICD-10-CM | POA: Diagnosis not present

## 2023-03-17 DIAGNOSIS — Z87891 Personal history of nicotine dependence: Secondary | ICD-10-CM | POA: Diagnosis not present

## 2023-03-17 DIAGNOSIS — O368331 Maternal care for abnormalities of the fetal heart rate or rhythm, third trimester, fetus 1: Secondary | ICD-10-CM | POA: Diagnosis not present

## 2023-03-17 DIAGNOSIS — O0993 Supervision of high risk pregnancy, unspecified, third trimester: Secondary | ICD-10-CM | POA: Diagnosis not present

## 2023-03-17 DIAGNOSIS — O10013 Pre-existing essential hypertension complicating pregnancy, third trimester: Secondary | ICD-10-CM | POA: Insufficient documentation

## 2023-03-17 DIAGNOSIS — O10919 Unspecified pre-existing hypertension complicating pregnancy, unspecified trimester: Secondary | ICD-10-CM | POA: Diagnosis not present

## 2023-03-17 DIAGNOSIS — O288 Other abnormal findings on antenatal screening of mother: Principal | ICD-10-CM | POA: Diagnosis present

## 2023-03-17 MED ORDER — ACETAMINOPHEN 500 MG PO TABS: 1000.0000 mg | ORAL_TABLET | Freq: Four times a day (QID) | ORAL | Status: DC | PRN

## 2023-03-17 NOTE — OB Triage Note (Signed)
 NST

## 2023-03-17 NOTE — Discharge Summary (Signed)
[name] is a 28 y.o. female. She is at [redacted]w[redacted]d gestation. Patient's last menstrual period was 07/05/2022. Estimated Date of Delivery: 04/11/23  Prenatal care site: [facility]  Chief complaint: Non Reactive NST   HPI: [name] was sent to L&D triage for further fetal evaluation after having a non reactive NST today at [facility] (03/17/2023). Denies vaginal bleeding, leakage of fluid, and contractions. Endorses active fetal movement.   Factors complicating pregnancy: History of prothrombin gene mutation and elevated Factor VIII  Chronic HTN Obesity Anxiety and Depression Elevated Liver enzymes Vitamin D Deficiency SGA  S: Resting comfortably. No contractions, no vaginal bleeding. Active fetal movement.   Maternal Medical History:  Past Medical Hx:  has a past medical history of Anxiety, Chest pain at rest, Chlamydia (6/15, 11/15, 2/16), Depression, Elevated factor VIII level (02/23/2012), Esophageal reflux, Family history of breast cancer (05/08/2013), HLD (hyperlipidemia), Hyperemesis gravidarum, Hypertension (02/2011), Irregular menses (03/19/2012), Menorrhagia (2012), Mononucleosis (01/08/2014), Palpitations, Pancreatitis (2012), Pre-diabetes (2014), Proctalgia, Prothrombin gene mutation (HCC) (10/2010), SOB (shortness of breath), and Vitamin D deficiency.    Past Surgical Hx:  has a past surgical history that includes Wisdom tooth extraction (N/A, 2013) and Cesarean section (08/31/2021).   Allergies  Allergen Reactions   Estrogens Other (See Comments)    Hx clotting disorder     Prior to Admission medications   Medication Sig Start Date End Date Taking? Authorizing Provider  acetaminophen (TYLENOL) 500 MG tablet Take 2 tablets (1,000 mg total) by mouth every 6 (six) hours. Patient not taking: Reported on 03/17/2023 09/02/21   Chari Manning, CNM  labetalol (NORMODYNE) 200 MG tablet Take 200 mg by mouth 3 (three) times daily.    [provider]  Prenatal Vit-Fe  Fumarate-FA (PRENATAL MULTIVITAMIN) TABS tablet Take 1 tablet by mouth daily at 12 noon.    [provider]    Social History: She  reports that she quit smoking about 7 years ago. Her smoking use included cigarettes. She has never used smokeless tobacco. She reports that she does not currently use alcohol. She reports that she does not use drugs.  Family History: family history includes Breast cancer in her paternal aunt; Clotting disorder in her mother; Heart attack in her maternal grandfather; Heart disease in her maternal grandfather; Hypertension in her maternal grandmother; Kidney disease in her sister; Stroke in her maternal grandfather. No history of gyn cancers  Review of Systems:  Review of Systems  Constitutional: Negative.   Eyes: Negative.   Respiratory: Negative.    Cardiovascular: Negative.   Gastrointestinal: Negative.   Genitourinary: Negative.   Neurological: Negative.   Psychiatric/Behavioral: Negative.       O:  BP (!) 150/84   Pulse 91   Temp 98 F (36.7 C) (Oral)   Resp 18   Ht 5\' 9"  (1.753 m)   Wt 122.5 kg   LMP 07/05/2022   BMI 39.87 kg/m  No results found for this or any previous visit (from the past 48 hours).   Constitutional: NAD, AAOx3  HE/ENT: extraocular movements grossly intact, moist mucous membranes CV: RRR PULM: nl respiratory effort, CTABL Abd: gravid, non-tender, non-distended, soft  Ext: Non-tender, Nonedmeatous Psych: mood appropriate, speech normal Pelvic : deferred  NST:  Baseline: 140 bpm Variability: moderate Accels: Present Decels: none Toco: none Category: I  INDICATIONS: Non reactive NST   RESULTS:  A NST procedure was performed with FHR monitoring and a normal baseline established, appropriate time of 20-40 minutes of evaluation, and accels >2  seen w 15x15 characteristics.  Results show a REACTIVE NST.   Assessment: 28 y.o. [redacted]w[redacted]d here for antenatal surveillance during pregnancy.  Principle diagnosis: Non  Reactive NST   Plan: Non Reactive NST  Labor: Not present. [place] and toco placed on abdomen. FHR tracing monitored for approximately 60 minutes.Toco showed no contractions. Patient denies feeling contractions.  US OB Limited ordered on 03/17/2023; Results: WNL       - AFI 9.2 cm (5%ile= 7.7 cm, 95%= 24.9 cm for 36 wks)        - Presentation: Cephalic  BPP ordered on 03/17/2023; Results:  WNL - 8/8 Fetal Wellbeing: Reassuring Cat 1 tracing. Reactive NST  Plan of care reviewed with D.V. Schermerhorn, MD.  D/c home stable, strict return precautions reviewed, follow-up as needed.   ----- Roney Jaffe, CNM Certified Nurse Midwife [facility]

## 2023-03-17 NOTE — OB Triage Note (Signed)
Discharge home. Left floor ambulatory. [name] S  

## 2023-03-22 ENCOUNTER — Other Ambulatory Visit: Payer: Medicaid Other

## 2023-03-22 ENCOUNTER — Other Ambulatory Visit: Payer: Self-pay

## 2023-03-22 ENCOUNTER — Encounter
Admission: RE | Admit: 2023-03-22 | Discharge: 2023-03-22 | Disposition: A | Discharge status: Home / Self Care | Payer: Medicaid Other | Source: Ambulatory Visit | Attending: Obstetrics and Gynecology | Admitting: Obstetrics and Gynecology

## 2023-03-22 DIAGNOSIS — O10913 Unspecified pre-existing hypertension complicating pregnancy, third trimester: Secondary | ICD-10-CM | POA: Diagnosis not present

## 2023-03-22 DIAGNOSIS — O0993 Supervision of high risk pregnancy, unspecified, third trimester: Secondary | ICD-10-CM | POA: Diagnosis not present

## 2023-03-22 HISTORY — DX: Unspecified pre-existing hypertension complicating pregnancy, unspecified trimester: O10.919

## 2023-03-22 HISTORY — DX: Mixed hyperlipidemia: E78.2

## 2023-03-22 NOTE — Patient Instructions (Addendum)
 Your procedure is scheduled on: Monday, March 3  Arrival Time: Please call Labor and Delivery the day before your scheduled C-Section to find out your arrival time. [phone].  Arrival: If your arrival time is prior to 6:00 am, please enter through the Emergency Room Entrance and you will be directed to Labor and Delivery. If your arrival time is 6:00 am or later, please enter the Medical Mall and follow the greeter's instructions.  REMEMBER: Instructions that are not followed completely may result in serious medical risk, up to and including death; or upon the discretion of your surgeon and anesthesiologist your surgery may need to be rescheduled.  Do not eat or drink after midnight the night before surgery.  No gum chewing or hard candies.  One week prior to surgery:  Stop Anti-inflammatories (NSAIDS) such as Advil, Aleve, Ibuprofen, Motrin, Naproxen, Naprosyn and Aspirin based products such as Excedrin, Goody's Powder, BC Powder. Stop ANY OVER THE COUNTER supplements until after surgery.  You may however, continue to take Tylenol if needed for pain up until the day of surgery.  Continue taking all of your other prescription medications up until the day of surgery.  ON THE DAY OF SURGERY ONLY TAKE THESE MEDICATIONS WITH SIPS OF WATER:  Labetalol  No Alcohol for 24 hours before or after surgery.  No Smoking including e-cigarettes for 24 hours prior to surgery.  No chewable tobacco products for at least 6 hours prior to surgery.  No nicotine patches on the day of surgery.  Do not use any "recreational" drugs for at least a week prior to your surgery.  Please be advised that the combination of cocaine and anesthesia may have negative outcomes, up to and including death. If you test positive for cocaine, your surgery will be cancelled.  On the morning of surgery brush your teeth with toothpaste and water, you may rinse your mouth with mouthwash if you wish. Do not swallow any  toothpaste or mouthwash.  Use CHG wipes as directed on instruction sheet.  Do not wear jewelry, make-up, hairpins, clips or nail polish.  For welded (permanent) jewelry: bracelets, anklets, waist bands, etc.  Please have this removed prior to surgery.  If it is not removed, there is a chance that hospital personnel will need to cut it off on the day of surgery.  Do not wear lotions, powders, or perfumes.   Do not shave body hair from the neck down 48 hours before surgery.  Contact lenses, hearing aids and dentures may not be worn into surgery.  Do not bring valuables to the hospital. [facility] is not responsible for any missing/lost belongings or valuables.   Notify your doctor if there is any change in your medical condition (cold, fever, infection).  Wear comfortable clothing (specific to your surgery type) to the hospital.  After surgery, you can help prevent lung complications by doing breathing exercises.  Take deep breaths and cough every 1-2 hours. Your doctor may order a device called an Incentive Spirometer to help you take deep breaths. When coughing or sneezing, hold a pillow firmly against your incision with both hands. This is called "splinting." Doing this helps protect your incision. It also decreases belly discomfort.  Please call the Pre-admissions Testing Dept. at [phone] if you have any questions about these instructions.  Surgery Visitation Policy:  Visitor Passes   All visitors, including children, need an identification sticker when visiting. These stickers must be worn where they can be seen.   Labor &  Delivery  Laboring women may have one designated support person and two other visitors of any age visit. The support person must remain the same. The visitors may switch with other visitors. Visitation is permitted 24 hours per day. The designated support person or a visitor over the age of 16 may sleep overnight in the patient's room. A doula  registered with [facility] for labor and delivery support is not considered a visitor. Doulas not registered with [facility] are considered visitors.  Mother Baby Unit, OB Specialty and Gynecological Care  A designated support person and three visitors of any age may visit. The three visitors may switch out. The designated support person or a visitor age 2 or older may stay overnight in the room. During the postpartum period (up to 6 weeks), if the mother is the patient, she can have her newborn stay with her if there is another support person present who can be responsible for the baby.   Temporary Visitor Restrictions Due to increasing cases of flu, RSV and COVID-19: Children ages 61 and under will not be able to visit patients in [facility] hospitals under most circumstances.  Preparing the Skin Before Surgery     To help prevent the risk of infection at your surgical site, we are now providing you with rinse-free Sage 2% Chlorhexidine Gluconate (CHG) disposable wipes.  Chlorhexidine Gluconate (CHG) Soap  o An antiseptic cleaner that kills germs and bonds with the skin to continue killing germs even after washing  o Used for showering the night before surgery and morning of surgery  The night before surgery: Shower or bathe with warm water. Do not apply perfume, lotions, powders. Wait one hour after shower. Skin should be dry and cool. Open Sage wipe package - use 6 disposable cloths. Wipe body using one cloth for the right arm, one cloth for the left arm, one cloth for the right leg, one cloth for the left leg, one cloth for the chest/abdomen area, and one cloth for the back. Do not use on open wounds or sores. Do not use on face or genitals (private parts). If you are breast feeding, do not use on breasts. 5. Do not rinse, allow to dry. 6. Skin may feel "tacky" for several minutes. 7. Dress in clean clothes. 8. Place clean sheets on your bed and do not sleep with  pets.  REPEAT ABOVE ON THE MORNING OF SURGERY BEFORE ARRIVING TO THE HOSPITAL.

## 2023-03-24 ENCOUNTER — Encounter
Admission: RE | Admit: 2023-03-24 | Discharge: 2023-03-24 | Disposition: A | Discharge status: Home / Self Care | Payer: Medicaid Other | Source: Ambulatory Visit | Attending: Obstetrics and Gynecology | Admitting: Obstetrics and Gynecology

## 2023-03-24 DIAGNOSIS — Z01812 Encounter for preprocedural laboratory examination: Secondary | ICD-10-CM | POA: Diagnosis not present

## 2023-03-24 DIAGNOSIS — O0993 Supervision of high risk pregnancy, unspecified, third trimester: Secondary | ICD-10-CM | POA: Diagnosis not present

## 2023-03-24 DIAGNOSIS — O10919 Unspecified pre-existing hypertension complicating pregnancy, unspecified trimester: Secondary | ICD-10-CM | POA: Diagnosis not present

## 2023-03-24 DIAGNOSIS — O10913 Unspecified pre-existing hypertension complicating pregnancy, third trimester: Secondary | ICD-10-CM | POA: Diagnosis not present

## 2023-03-24 LAB — TYPE AND SCREEN
ABO/RH(D): A POS
Antibody Screen: NEGATIVE
Extend sample reason: UNDETERMINED

## 2023-03-24 LAB — BASIC METABOLIC PANEL
Anion gap: 8 (ref 5–15)
BUN: 8 mg/dL (ref 6–20)
CO2: 21 mmol/L — ABNORMAL LOW (ref 22–32)
Calcium: 9.1 mg/dL (ref 8.9–10.3)
Chloride: 107 mmol/L (ref 98–111)
Creatinine, Ser: 0.54 mg/dL (ref 0.44–1.00)
GFR, Estimated: >60 mL/min (ref >60)
Glucose, Bld: 88 mg/dL (ref 70–99)
Potassium: 3.9 mmol/L (ref 3.5–5.1)
Sodium: 136 mmol/L (ref 135–145)

## 2023-03-24 LAB — CBC
HCT: 34.6 % — ABNORMAL LOW (ref 36.0–46.0)
Hemoglobin: 11.4 g/dL — ABNORMAL LOW (ref 12.0–15.0)
MCH: 24.7 pg — ABNORMAL LOW (ref 26.0–34.0)
MCHC: 32.9 g/dL (ref 30.0–36.0)
MCV: 74.9 fL — ABNORMAL LOW (ref 80.0–100.0)
Platelets: 314 10*3/uL (ref 150–400)
RBC: 4.62 MIL/uL (ref 3.87–5.11)
RDW: 14.8 % (ref 11.5–15.5)
WBC: 7.2 10*3/uL (ref 4.0–10.5)
nRBC: 0 % (ref 0.0–0.2)

## 2023-03-25 DIAGNOSIS — Z419 Encounter for procedure for purposes other than remedying health state, unspecified: Secondary | ICD-10-CM | POA: Diagnosis not present

## 2023-03-25 LAB — RPR: RPR Ser Ql: NONREACTIVE

## 2023-03-26 MED ORDER — GABAPENTIN 300 MG PO CAPS
300.0000 mg | ORAL_CAPSULE | Freq: Once | ORAL | Status: AC
  Administered 2023-03-27: 300 mg via ORAL
  Filled 2023-03-26: qty 1

## 2023-03-26 MED ORDER — LACTATED RINGERS IV SOLN: Freq: Once | INTRAVENOUS | Status: AC

## 2023-03-26 MED ORDER — CHLORHEXIDINE GLUCONATE 0.12 % MT SOLN
15.0000 mL | Freq: Once | OROMUCOSAL | Status: AC
  Administered 2023-03-27: 15 mL via OROMUCOSAL
  Filled 2023-03-26: qty 15

## 2023-03-26 MED ORDER — LACTATED RINGERS IV SOLN: INTRAVENOUS | Status: DC

## 2023-03-26 MED ORDER — ORAL CARE MOUTH RINSE: 15.0000 mL | Freq: Once | OROMUCOSAL | Status: AC

## 2023-03-26 MED ORDER — CEFAZOLIN SODIUM-DEXTROSE 3-4 GM/150ML-% IV SOLN
3.0000 g | INTRAVENOUS | Status: AC
  Administered 2023-03-27: 3 g via INTRAVENOUS
  Filled 2023-03-26: qty 150

## 2023-03-26 MED ORDER — SOD CITRATE-CITRIC ACID 500-334 MG/5ML PO SOLN
30.0000 mL | ORAL | Status: AC
  Administered 2023-03-27: 30 mL via ORAL

## 2023-03-26 MED ORDER — ACETAMINOPHEN 500 MG PO TABS
1000.0000 mg | ORAL_TABLET | Freq: Once | ORAL | Status: AC
  Administered 2023-03-27: 1000 mg via ORAL
  Filled 2023-03-26: qty 2

## 2023-03-27 ENCOUNTER — Inpatient Hospital Stay: Payer: Self-pay | Admitting: Urgent Care

## 2023-03-27 ENCOUNTER — Encounter
Admission: RE | Disposition: A | Discharge status: Home / Self Care | Payer: Self-pay | Source: Home / Self Care | Attending: Obstetrics and Gynecology

## 2023-03-27 ENCOUNTER — Other Ambulatory Visit: Payer: Self-pay

## 2023-03-27 ENCOUNTER — Inpatient Hospital Stay: Payer: Self-pay

## 2023-03-27 ENCOUNTER — Encounter: Payer: Self-pay | Admitting: Obstetrics and Gynecology

## 2023-03-27 ENCOUNTER — Inpatient Hospital Stay
Admission: RE | Admit: 2023-03-27 | Discharge: 2023-03-29 | DRG: 787 | Disposition: A | Discharge status: Home / Self Care | Payer: Medicaid Other | Attending: Obstetrics | Admitting: Obstetrics

## 2023-03-27 DIAGNOSIS — O99214 Obesity complicating childbirth: Secondary | ICD-10-CM | POA: Diagnosis present

## 2023-03-27 DIAGNOSIS — O9972 Diseases of the skin and subcutaneous tissue complicating childbirth: Secondary | ICD-10-CM | POA: Diagnosis not present

## 2023-03-27 DIAGNOSIS — O9962 Diseases of the digestive system complicating childbirth: Secondary | ICD-10-CM | POA: Diagnosis not present

## 2023-03-27 DIAGNOSIS — Z7982 Long term (current) use of aspirin: Secondary | ICD-10-CM

## 2023-03-27 DIAGNOSIS — L910 Hypertrophic scar: Secondary | ICD-10-CM | POA: Diagnosis present

## 2023-03-27 DIAGNOSIS — O3413 Maternal care for benign tumor of corpus uteri, third trimester: Secondary | ICD-10-CM | POA: Diagnosis present

## 2023-03-27 DIAGNOSIS — Z3A37 37 weeks gestation of pregnancy: Secondary | ICD-10-CM | POA: Diagnosis not present

## 2023-03-27 DIAGNOSIS — Z8249 Family history of ischemic heart disease and other diseases of the circulatory system: Secondary | ICD-10-CM | POA: Diagnosis not present

## 2023-03-27 DIAGNOSIS — O10919 Unspecified pre-existing hypertension complicating pregnancy, unspecified trimester: Secondary | ICD-10-CM | POA: Diagnosis present

## 2023-03-27 DIAGNOSIS — K219 Gastro-esophageal reflux disease without esophagitis: Secondary | ICD-10-CM | POA: Diagnosis not present

## 2023-03-27 DIAGNOSIS — R748 Abnormal levels of other serum enzymes: Secondary | ICD-10-CM | POA: Diagnosis present

## 2023-03-27 DIAGNOSIS — D250 Submucous leiomyoma of uterus: Secondary | ICD-10-CM | POA: Diagnosis present

## 2023-03-27 DIAGNOSIS — O9912 Other diseases of the blood and blood-forming organs and certain disorders involving the immune mechanism complicating childbirth: Secondary | ICD-10-CM | POA: Diagnosis not present

## 2023-03-27 DIAGNOSIS — D6852 Prothrombin gene mutation: Secondary | ICD-10-CM | POA: Diagnosis present

## 2023-03-27 DIAGNOSIS — O34211 Maternal care for low transverse scar from previous cesarean delivery: Principal | ICD-10-CM | POA: Diagnosis present

## 2023-03-27 DIAGNOSIS — Z79899 Other long term (current) drug therapy: Secondary | ICD-10-CM

## 2023-03-27 DIAGNOSIS — O0993 Supervision of high risk pregnancy, unspecified, third trimester: Secondary | ICD-10-CM | POA: Diagnosis not present

## 2023-03-27 DIAGNOSIS — Z8759 Personal history of other complications of pregnancy, childbirth and the puerperium: Secondary | ICD-10-CM

## 2023-03-27 DIAGNOSIS — O365930 Maternal care for other known or suspected poor fetal growth, third trimester, not applicable or unspecified: Secondary | ICD-10-CM | POA: Diagnosis present

## 2023-03-27 DIAGNOSIS — O10913 Unspecified pre-existing hypertension complicating pregnancy, third trimester: Secondary | ICD-10-CM | POA: Diagnosis not present

## 2023-03-27 DIAGNOSIS — R7309 Other abnormal glucose: Secondary | ICD-10-CM | POA: Diagnosis present

## 2023-03-27 DIAGNOSIS — O1092 Unspecified pre-existing hypertension complicating childbirth: Secondary | ICD-10-CM | POA: Diagnosis not present

## 2023-03-27 DIAGNOSIS — Z87891 Personal history of nicotine dependence: Secondary | ICD-10-CM | POA: Diagnosis not present

## 2023-03-27 DIAGNOSIS — O34219 Maternal care for unspecified type scar from previous cesarean delivery: Principal | ICD-10-CM | POA: Diagnosis present

## 2023-03-27 DIAGNOSIS — D66 Hereditary factor VIII deficiency: Secondary | ICD-10-CM | POA: Diagnosis present

## 2023-03-27 SURGERY — Surgical Case
Anesthesia: Spinal

## 2023-03-27 MED ORDER — PHENYLEPHRINE HCL (PRESSORS) 10 MG/ML IV SOLN
INTRAVENOUS | Status: AC
  Filled 2023-03-27: qty 1

## 2023-03-27 MED ORDER — MORPHINE SULFATE (PF) 0.5 MG/ML IJ SOLN
INTRAMUSCULAR | Status: DC | PRN
  Administered 2023-03-27: 100 ug via EPIDURAL

## 2023-03-27 MED ORDER — IBUPROFEN 600 MG PO TABS: 600.0000 mg | ORAL_TABLET | Freq: Four times a day (QID) | ORAL | Status: DC

## 2023-03-27 MED ORDER — PRENATAL MULTIVITAMIN CH
1.0000 | ORAL_TABLET | Freq: Every day | ORAL | Status: DC
  Administered 2023-03-28 – 2023-03-29 (×2): 1 via ORAL
  Filled 2023-03-27 (×2): qty 1

## 2023-03-27 MED ORDER — OXYTOCIN-SODIUM CHLORIDE 30-0.9 UT/500ML-% IV SOLN: 2.5000 [IU]/h | INTRAVENOUS | Status: DC

## 2023-03-27 MED ORDER — ACETAMINOPHEN 500 MG PO TABS
1000.0000 mg | ORAL_TABLET | Freq: Four times a day (QID) | ORAL | Status: DC
  Administered 2023-03-27: 1000 mg via ORAL
  Filled 2023-03-27: qty 2

## 2023-03-27 MED ORDER — IBUPROFEN 600 MG PO TABS
600.0000 mg | ORAL_TABLET | Freq: Four times a day (QID) | ORAL | Status: DC
  Administered 2023-03-28 – 2023-03-29 (×3): 600 mg via ORAL
  Filled 2023-03-27 (×3): qty 1

## 2023-03-27 MED ORDER — SIMETHICONE 80 MG PO CHEW
80.0000 mg | CHEWABLE_TABLET | ORAL | Status: DC | PRN
  Administered 2023-03-28: 80 mg via ORAL
  Filled 2023-03-27: qty 1

## 2023-03-27 MED ORDER — MORPHINE SULFATE (PF) 0.5 MG/ML IJ SOLN
INTRAMUSCULAR | Status: AC
  Filled 2023-03-27: qty 10

## 2023-03-27 MED ORDER — ZOLPIDEM TARTRATE 5 MG PO TABS: 5.0000 mg | ORAL_TABLET | Freq: Every evening | ORAL | Status: DC | PRN

## 2023-03-27 MED ORDER — ONDANSETRON HCL 4 MG/2ML IJ SOLN
INTRAMUSCULAR | Status: DC | PRN
  Administered 2023-03-27: 4 mg via INTRAVENOUS

## 2023-03-27 MED ORDER — OXYTOCIN-SODIUM CHLORIDE 30-0.9 UT/500ML-% IV SOLN
INTRAVENOUS | Status: AC
  Filled 2023-03-27: qty 500

## 2023-03-27 MED ORDER — DEXMEDETOMIDINE HCL IN NACL 80 MCG/20ML IV SOLN
INTRAVENOUS | Status: AC
  Filled 2023-03-27: qty 20

## 2023-03-27 MED ORDER — ENOXAPARIN SODIUM 40 MG/0.4ML IJ SOSY
40.0000 mg | PREFILLED_SYRINGE | INTRAMUSCULAR | Status: DC
  Administered 2023-03-28 – 2023-03-29 (×2): 40 mg via SUBCUTANEOUS
  Filled 2023-03-27 (×2): qty 0.4

## 2023-03-27 MED ORDER — KETOROLAC TROMETHAMINE 30 MG/ML IJ SOLN: 30.0000 mg | Freq: Four times a day (QID) | INTRAMUSCULAR | Status: DC | PRN

## 2023-03-27 MED ORDER — GABAPENTIN 300 MG PO CAPS
300.0000 mg | ORAL_CAPSULE | Freq: Every day | ORAL | Status: DC
  Administered 2023-03-27 – 2023-03-28 (×2): 300 mg via ORAL
  Filled 2023-03-27 (×2): qty 1

## 2023-03-27 MED ORDER — ONDANSETRON HCL 4 MG/2ML IJ SOLN: 4.0000 mg | Freq: Three times a day (TID) | INTRAMUSCULAR | Status: DC | PRN

## 2023-03-27 MED ORDER — FENTANYL CITRATE (PF) 100 MCG/2ML IJ SOLN
INTRAMUSCULAR | Status: DC | PRN
  Administered 2023-03-27: 15 ug via INTRATHECAL

## 2023-03-27 MED ORDER — BUPIVACAINE IN DEXTROSE 0.75-8.25 % IT SOLN
INTRATHECAL | Status: DC | PRN
  Administered 2023-03-27: 1.6 mL via INTRATHECAL

## 2023-03-27 MED ORDER — SODIUM CHLORIDE 0.9% FLUSH: 3.0000 mL | INTRAVENOUS | Status: DC | PRN

## 2023-03-27 MED ORDER — OXYCODONE HCL 5 MG PO TABS: 5.0000 mg | ORAL_TABLET | ORAL | Status: DC | PRN

## 2023-03-27 MED ORDER — NALOXONE HCL 4 MG/10ML IJ SOLN: 1.0000 ug/kg/h | INTRAVENOUS | Status: DC | PRN

## 2023-03-27 MED ORDER — NALOXONE HCL 0.4 MG/ML IJ SOLN: 0.4000 mg | INTRAMUSCULAR | Status: DC | PRN

## 2023-03-27 MED ORDER — WITCH HAZEL-GLYCERIN EX PADS: 1.0000 | MEDICATED_PAD | CUTANEOUS | Status: DC | PRN

## 2023-03-27 MED ORDER — NIFEDIPINE ER OSMOTIC RELEASE 30 MG PO TB24
30.0000 mg | ORAL_TABLET | Freq: Every day | ORAL | Status: DC
  Administered 2023-03-27 – 2023-03-28 (×2): 30 mg via ORAL
  Filled 2023-03-27 (×2): qty 1

## 2023-03-27 MED ORDER — DIBUCAINE (PERIANAL) 1 % EX OINT: 1.0000 | TOPICAL_OINTMENT | CUTANEOUS | Status: DC | PRN

## 2023-03-27 MED ORDER — DIPHENHYDRAMINE HCL 25 MG PO CAPS: 25.0000 mg | ORAL_CAPSULE | Freq: Four times a day (QID) | ORAL | Status: DC | PRN

## 2023-03-27 MED ORDER — SODIUM CHLORIDE 0.9% FLUSH
INTRAVENOUS | Status: DC | PRN
  Administered 2023-03-27: 20 mL

## 2023-03-27 MED ORDER — MORPHINE SULFATE (PF) 2 MG/ML IV SOLN: 1.0000 mg | INTRAVENOUS | Status: DC | PRN

## 2023-03-27 MED ORDER — SENNOSIDES-DOCUSATE SODIUM 8.6-50 MG PO TABS: 2.0000 | ORAL_TABLET | Freq: Every day | ORAL | Status: DC

## 2023-03-27 MED ORDER — KETOROLAC TROMETHAMINE 30 MG/ML IJ SOLN
30.0000 mg | Freq: Four times a day (QID) | INTRAMUSCULAR | Status: AC
Start: 2023-03-27 — End: 2023-03-28
  Administered 2023-03-27 – 2023-03-28 (×4): 30 mg via INTRAVENOUS
  Filled 2023-03-27 (×4): qty 1

## 2023-03-27 MED ORDER — MENTHOL 3 MG MT LOZG: 1.0000 | LOZENGE | OROMUCOSAL | Status: DC | PRN

## 2023-03-27 MED ORDER — SIMETHICONE 80 MG PO CHEW
80.0000 mg | CHEWABLE_TABLET | Freq: Three times a day (TID) | ORAL | Status: DC
  Administered 2023-03-27 – 2023-03-29 (×6): 80 mg via ORAL
  Filled 2023-03-27 (×5): qty 1

## 2023-03-27 MED ORDER — BUPIVACAINE HCL (PF) 0.5 % IJ SOLN
INTRAMUSCULAR | Status: DC | PRN
  Administered 2023-03-27: 60 mL

## 2023-03-27 MED ORDER — BUPIVACAINE HCL (PF) 0.5 % IJ SOLN
INTRAMUSCULAR | Status: AC
  Filled 2023-03-27: qty 60

## 2023-03-27 MED ORDER — ONDANSETRON HCL 4 MG/2ML IJ SOLN
INTRAMUSCULAR | Status: AC
  Filled 2023-03-27: qty 2

## 2023-03-27 MED ORDER — KETOROLAC TROMETHAMINE 30 MG/ML IJ SOLN
INTRAMUSCULAR | Status: DC | PRN
  Administered 2023-03-27: 30 mg via INTRAVENOUS

## 2023-03-27 MED ORDER — SOD CITRATE-CITRIC ACID 500-334 MG/5ML PO SOLN
ORAL | Status: AC
Start: 2023-03-27 — End: 2023-03-27
  Filled 2023-03-27: qty 15

## 2023-03-27 MED ORDER — DIPHENHYDRAMINE HCL 50 MG/ML IJ SOLN: 12.5000 mg | INTRAMUSCULAR | Status: DC | PRN

## 2023-03-27 MED ORDER — DEXAMETHASONE SODIUM PHOSPHATE 10 MG/ML IJ SOLN
INTRAMUSCULAR | Status: DC | PRN
  Administered 2023-03-27: 10 mg via INTRAVENOUS

## 2023-03-27 MED ORDER — PHENYLEPHRINE HCL-NACL 20-0.9 MG/250ML-% IV SOLN
INTRAVENOUS | Status: DC | PRN
  Administered 2023-03-27: 25 ug/min via INTRAVENOUS

## 2023-03-27 MED ORDER — KETOROLAC TROMETHAMINE 30 MG/ML IJ SOLN: 30.0000 mg | Freq: Four times a day (QID) | INTRAMUSCULAR | Status: DC

## 2023-03-27 MED ORDER — FENTANYL CITRATE (PF) 100 MCG/2ML IJ SOLN
INTRAMUSCULAR | Status: AC
  Filled 2023-03-27: qty 2

## 2023-03-27 MED ORDER — DEXAMETHASONE SODIUM PHOSPHATE 10 MG/ML IJ SOLN
INTRAMUSCULAR | Status: AC
  Filled 2023-03-27: qty 1

## 2023-03-27 MED ORDER — NIFEDIPINE ER OSMOTIC RELEASE 30 MG PO TB24: 30.0000 mg | ORAL_TABLET | Freq: Every day | ORAL | Status: DC

## 2023-03-27 MED ORDER — SIMETHICONE 80 MG PO CHEW: 80.0000 mg | CHEWABLE_TABLET | ORAL | Status: DC | PRN

## 2023-03-27 MED ORDER — ENOXAPARIN SODIUM 40 MG/0.4ML IJ SOSY: 40.0000 mg | PREFILLED_SYRINGE | INTRAMUSCULAR | Status: DC

## 2023-03-27 MED ORDER — DEXMEDETOMIDINE HCL IN NACL 80 MCG/20ML IV SOLN
INTRAVENOUS | Status: DC | PRN
  Administered 2023-03-27: 8 ug via INTRAVENOUS

## 2023-03-27 MED ORDER — PRENATAL MULTIVITAMIN CH: 1.0000 | ORAL_TABLET | Freq: Every day | ORAL | Status: DC

## 2023-03-27 MED ORDER — SIMETHICONE 80 MG PO CHEW: 80.0000 mg | CHEWABLE_TABLET | Freq: Three times a day (TID) | ORAL | Status: DC

## 2023-03-27 MED ORDER — ACETAMINOPHEN 500 MG PO TABS
1000.0000 mg | ORAL_TABLET | Freq: Four times a day (QID) | ORAL | Status: DC
  Administered 2023-03-27 – 2023-03-29 (×7): 1000 mg via ORAL
  Filled 2023-03-27 (×7): qty 2

## 2023-03-27 MED ORDER — SCOPOLAMINE 1 MG/3DAYS TD PT72: 1.0000 | MEDICATED_PATCH | Freq: Once | TRANSDERMAL | Status: DC

## 2023-03-27 MED ORDER — DIPHENHYDRAMINE HCL 25 MG PO CAPS: 25.0000 mg | ORAL_CAPSULE | ORAL | Status: DC | PRN

## 2023-03-27 MED ORDER — COCONUT OIL OIL: 1.0000 | TOPICAL_OIL | Status: DC | PRN

## 2023-03-27 MED ORDER — KETOROLAC TROMETHAMINE 30 MG/ML IJ SOLN
INTRAMUSCULAR | Status: AC
  Filled 2023-03-27: qty 1

## 2023-03-27 MED ORDER — LACTATED RINGERS IV BOLUS
500.0000 mL | Freq: Once | INTRAVENOUS | Status: AC
  Administered 2023-03-27: 500 mL via INTRAVENOUS

## 2023-03-27 MED ORDER — GABAPENTIN 300 MG PO CAPS: 300.0000 mg | ORAL_CAPSULE | Freq: Every day | ORAL | Status: DC

## 2023-03-27 MED ORDER — SENNOSIDES-DOCUSATE SODIUM 8.6-50 MG PO TABS
2.0000 | ORAL_TABLET | Freq: Every day | ORAL | Status: DC
  Administered 2023-03-28 – 2023-03-29 (×2): 2 via ORAL
  Filled 2023-03-27 (×2): qty 2

## 2023-03-27 MED ORDER — OXYTOCIN-SODIUM CHLORIDE 30-0.9 UT/500ML-% IV SOLN
INTRAVENOUS | Status: DC | PRN
  Administered 2023-03-27: 100 mL via INTRAVENOUS

## 2023-03-27 MED ORDER — SODIUM CHLORIDE 0.9 % IV SOLN
500.0000 mg | INTRAVENOUS | Status: DC
  Filled 2023-03-27: qty 5

## 2023-03-27 MED ORDER — MEPERIDINE HCL 25 MG/ML IJ SOLN: 6.2500 mg | INTRAMUSCULAR | Status: DC | PRN

## 2023-03-27 MED ORDER — ACETAMINOPHEN 500 MG PO TABS: 1000.0000 mg | ORAL_TABLET | Freq: Four times a day (QID) | ORAL | Status: DC

## 2023-03-27 SURGICAL SUPPLY — 28 items
BARRIER ADHS 3X4 INTERCEED (GAUZE/BANDAGES/DRESSINGS) ×1 IMPLANT
CHLORAPREP W/TINT 26 (MISCELLANEOUS) ×1 IMPLANT
DRSG TELFA 3X8 NADH STRL (GAUZE/BANDAGES/DRESSINGS) ×1 IMPLANT
ELECT CAUTERY BLADE 6.4 (BLADE) ×1 IMPLANT
ELECT REM PT RETURN 9FT ADLT (ELECTROSURGICAL) ×1 IMPLANT
ELECTRODE REM PT RTRN 9FT ADLT (ELECTROSURGICAL) ×1 IMPLANT
GAUZE SPONGE 4X4 12PLY STRL (GAUZE/BANDAGES/DRESSINGS) ×1 IMPLANT
GLOVE SURG SYN 8.0 (GLOVE) ×1 IMPLANT
GLOVE SURG SYN 8.0 PF PI (GLOVE) ×1 IMPLANT
GOWN STRL REUS W/ TWL LRG LVL3 (GOWN DISPOSABLE) ×2 IMPLANT
GOWN STRL REUS W/ TWL XL LVL3 (GOWN DISPOSABLE) ×1 IMPLANT
MANIFOLD NEPTUNE II (INSTRUMENTS) ×1 IMPLANT
MAT PREVALON FULL STRYKER (MISCELLANEOUS) ×1 IMPLANT
NDL HYPO 22X1.5 SAFETY MO (MISCELLANEOUS) ×1 IMPLANT
NEEDLE HYPO 22X1.5 SAFETY MO (MISCELLANEOUS) ×1 IMPLANT
NS IRRIG 1000ML POUR BTL (IV SOLUTION) ×1 IMPLANT
PACK C SECTION AR (MISCELLANEOUS) ×1 IMPLANT
PAD OB MATERNITY 11 LF (PERSONAL CARE ITEMS) ×1 IMPLANT
PAD PREP OB/GYN DISP 24X41 (PERSONAL CARE ITEMS) ×1 IMPLANT
SCRUB CHG 4% DYNA-HEX 4OZ (MISCELLANEOUS) ×1 IMPLANT
STAPLER INSORB 30 2030 C-SECTI (MISCELLANEOUS) IMPLANT
STRAP SAFETY 5IN WIDE (MISCELLANEOUS) ×1 IMPLANT
SUT CHROMIC 1 CTX 36 (SUTURE) ×3 IMPLANT
SUT PLAIN GUT 0 (SUTURE) ×2 IMPLANT
SUT VIC AB 0 CT1 36 (SUTURE) ×2 IMPLANT
SYR 30ML LL (SYRINGE) ×2 IMPLANT
TRAP FLUID SMOKE EVACUATOR (MISCELLANEOUS) ×1 IMPLANT
WATER STERILE IRR 500ML POUR (IV SOLUTION) ×1 IMPLANT

## 2023-03-27 NOTE — Transfer of Care (Signed)
 Immediate Anesthesia Transfer of Care Note  Patient: [name]  Procedure(s) Performed: REPEAT CESAREAN SECTION  Patient Location:  Labor and delivery post op unit  Anesthesia Type:Spinal  Level of Consciousness: awake  Airway & Oxygen Therapy: Patient Spontanous Breathing  Post-op Assessment: Report given to RN and Post -op Vital signs reviewed and stable  Post vital signs: Reviewed and stable  Last Vitals:  Vitals Value Taken Time  BP    Temp    Pulse    Resp    SpO2      Last Pain:  Vitals:   03/27/23 0841  TempSrc:   PainSc: 0-No pain         Complications: No notable events documented.

## 2023-03-27 NOTE — Op Note (Unsigned)
 [name], [name] MEDICAL RECORD NO: [id number] ACCOUNT NO: [id number] DATE OF BIRTH: Jun 09, 1995 FACILITY: [facility] LOCATION: [facility] PHYSICIAN: Suzy Bouchard, MD  Operative Report   DATE OF PROCEDURE: 03/27/2023   PREOPERATIVE DIAGNOSES: 1. 37+6 weeks estimated gestational age. 2. Chronic hypertension, on medication. 3. Elective repeat cesarean section.  POSTOPERATIVE DIAGNOSES: 1. 37+6 weeks estimated gestational age. 2. Chronic hypertension, on medication. 3. Elective repeat cesarean section. 4. Vigorous female delivered. 5. 2x1 cm submucosal fibroid anterior fundal wall  PROCEDURE: Repeat cesarean section.  SURGEON: Suzy Bouchard, MD  FIRST ASSISTANT: Margaretmary Eddy, certified nurse midwife.  ANESTHESIA: Spinal.  INDICATIONS: A 28 year old gravida 3, para 2 patient with a prior history of cesarean section. The patient has chronic hypertension and is on medication. She is 37+6 weeks estimated gestational age and elects for a repeat cesarean section. The patient  declines sterilization.  DESCRIPTION OF PROCEDURE: After adequate spinal anesthesia, the patient was placed in the dorsal supine position. Hip roll on the right side. The patient's abdomen, perineum, and vagina were prepped and draped in normal sterile fashion. The patient did  receive 3 g of IV Ancef prior to commencement of the case. Timeout was performed. A Pfannenstiel incision was made after excising a cicatrix, which had keloid formation. The incision was carried down through the subcutaneous tissue and the fascia was  identified and was scored in the midline. The fascia was opened in a transverse fashion bilaterally. The superior aspect of the fascia was grasped with Kocher clamps and the recti muscles were dissected free. Inferior aspect of the fascia was grasped  with Kocher clamps. The pyramidalis muscle was dissected free. Entry into the peritoneal cavity was accomplished sharply. The direct  low uterine segment incision was made given that the fascicular uterine peritoneal fold could not be opened and  identified. Upon entry into the endometrial cavity, clear fluid resulted. The fetal head was then elevated and delivered through the incision with fundal pressure by Margaretmary Eddy, certified nurse midwife. A loose nuchal cord was reduced and the shoulders  and body were delivered without difficulty. Vigorous female was then dried on the abdomen for 60 seconds. The cord was doubly clamped and infant was passed to nursery staff who assigned APGAR scores of 9 and 10. Fetal weight 6 pounds 2 ounces, vigorous  female. The placenta was manually delivered and the uterus was exteriorized. The endometrial cavity was wiped clean through laparotomy tape. Of note, there was a 2 x 1 cm submucosal fibroid that was palpated on examination anterior fundal wall of uterus. The uterine incision was then  closed with 1 chromic suture in a running locking fashion. Good approximation of edges. Good hemostasis noted. Fallopian tubes and nerve roots appeared normal. Posterior cul-de-sac was irrigated and suctioned and the uterus was placed back into the  abdominal cavity under direct visualization. The paracolic gutters were wiped clean with laparotomy tape and uterine incision again appeared hemostatic. Interceed was placed over the uterine incision in T-shaped fashion and the fascia was then closed  with 0 Vicryl suture in a running non-locking fashion. Two separate sutures used. The fascial wedges were injected with a solution of 0.5% Marcaine with 60 mL of Marcaine and 20 mL of normal saline. 30 mL of the solution was injected into the fascial  edges. Subcutaneous tissues were irrigated and Bovie for hemostasis and given the depth of the subcutaneous tissues of 4.5 cm, the subcutaneous tissues were closed with a running 2-0 chromic suture. The  skin was reapproximated with Insorb absorbable  staples. Good cosmetic  effect. The patient tolerated the procedure well. The patient did receive 30 mg intravenous Toradol at the end of the procedure.   Quantitative blood loss: 530 mL.   Interoperative fluid was 300 mL.   Urine output 100 mL. The patient tolerated the procedure and was taken to the recovery room in good condition.     SUJ D: 03/27/2023 12:19:04 pm T: 03/27/2023 9:52:00 pm  JOB: [id number]

## 2023-03-27 NOTE — Discharge Summary (Signed)
 Obstetrical Discharge Summary  Patient Name: [name] DOB: 04-Nov-1995 MRN: [id number]  Date of Admission: 03/27/2023 Date of Delivery: 03/27/23 Delivered by: Beverly Gust MD Date of Discharge:03/29/2023  Primary OB: [facility]  WUJ:[id number] last menstrual period was 07/05/2022. EDC Estimated Date of Delivery: 04/11/23 Gestational Age at Delivery: [redacted]w[redacted]d   Antepartum complications: Chronic HTN on meds, prothrombin gene defect  Admitting Diagnosis: elective repeat cesarean  Secondary Diagnosis: Patient Active Problem List   Diagnosis Date Noted   Previous cesarean delivery affecting pregnancy 03/27/2023   Elevated glucose tolerance test 10/27/2022   Elevated liver enzymes 10/27/2022   Supervision of high risk pregnancy, antepartum 09/28/2022   History of postpartum hemorrhage 02/27/2019   Chronic hypertension affecting pregnancy 02/26/2019   Family history of autism    Vitamin D deficiency 11/24/2017   Anxiety and depression 06/16/2016   Insulin resistance 02/01/2012   Pancreatitis, acute 02/01/2012   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    Augmentation: N/A Complications: None Intrapartum complications/course: uncomplicated repeat LTCS Date of Delivery: 03/29/2023  Delivered By: Beverly Gust MD Delivery Type: repeat cesarean section, low transverse incision Anesthesia: spinal Placenta: maual Episiotomy: none Newborn Data: Live born child female  Birth Weight:  #6/2 APGAR: , 9/10  Newborn Delivery   Birth date/time: 03/27/2023 00:00:00 Delivery type: C-Section, Low Transverse Trial of labor: No C-section categorization: Repeat       Postpartum Procedures: none  Edinburgh:     03/28/2023    8:00 AM 09/02/2021   12:05 AM 03/01/2019   12:10 PM 02/28/2019    7:30 AM  Edinburgh Postnatal Depression Scale Screening Tool  I have been able to laugh and see the funny side of things. 0 0 0 --  I have looked  forward with enjoyment to things. 1 2 0   I have blamed myself unnecessarily when things went wrong. 1 1 2    I have been anxious or worried for no good reason. 1 2 2    I have felt scared or panicky for no good reason. 1 1 1    Things have been getting on top of me. 1 1 1    I have been so unhappy that I have had difficulty sleeping. 0 1 1   I have felt sad or miserable. 1 2 1    I have been so unhappy that I have been crying. 0 2 1   The thought of harming myself has occurred to me. 0 0 0   Edinburgh Postnatal Depression Scale Total 6 12 9      Post partum course:  (Cesarean Section):  Patient had an uncomplicated postpartum course.  By time of discharge on POD#2, her pain was controlled on oral pain medications; she had appropriate lochia and was ambulating, voiding without difficulty, tolerating regular diet and passing flatus. She was deemed stable for discharge to home.    Discharge Physical Exam:  BP (!) 139/94 (BP Location: Right Arm) Comment: nurse Anna notified  Pulse (!) 110 Comment: nurse Anna notified  Temp 97.6 F (36.4 C) (Oral)   Resp 19   Ht 5\' 9"  (1.753 m)   Wt 122.5 kg   LMP 07/05/2022   SpO2 96%   Breastfeeding Unknown   BMI 39.87 kg/m   General: NAD CV: RRR Pulm: CTABL, nl effort ABD: s/nd/nt, fundus firm and below the umbilicus Lochia: moderate Incision: c/d/i DVT Evaluation: LE non-ttp, no evidence of DVT on exam.  Hemoglobin  Date Value Ref Range  Status  03/28/2023 10.6 (L) 12.0 - 15.0 g/dL Final   HGB  Date Value Ref Range Status  01/08/2014 12.7 12.0 - 16.0 g/dL Final   HCT  Date Value Ref Range Status  03/28/2023 33.0 (L) 36.0 - 46.0 % Final  01/08/2014 38.4 35.0 - 47.0 % Final     Disposition: stable, discharge to home. Baby Feeding: formula Baby Disposition: home with mom  Rh Immune globulin given: not indicated Rubella vaccine given: not indicated Tdap vaccine given in AP or PP setting: declined Flu vaccine given in AP or PP setting:  declined  Contraception: none  Prenatal Labs:  Prenatal labs: ABO, Rh:  A+ Antibody:  neg Rubella:  Imm , VZ : Imm RPR:   NR HBsAg:   neg Hep C: NR HIV:   neg GBS:  neg    pt will need to be on 6 weeks Prophylactic Lovenox 40 mg q d   Plan:  [name] was discharged to home in good condition. Follow-up appointment with delivering provider in 6 weeks.  Discharge Medications: Allergies as of 03/29/2023       Reactions   Estrogens Other (See Comments)   Hx clotting disorder        Medication List     STOP taking these medications    labetalol 200 MG tablet Commonly known as: NORMODYNE   prenatal multivitamin Tabs tablet       TAKE these medications    acetaminophen 500 MG tablet Commonly known as: TYLENOL Take 2 tablets (1,000 mg total) by mouth every 6 (six) hours. What changed: when to take this   coconut oil Oil Apply 1 Application topically as needed.   dibucaine 1 % Oint Commonly known as: NUPERCAINAL Place 1 Application rectally as needed for hemorrhoids.   enoxaparin 40 MG/0.4ML injection Commonly known as: LOVENOX Inject 0.4 mLs (40 mg total) into the skin daily.   ibuprofen 600 MG tablet Commonly known as: ADVIL Take 1 tablet (600 mg total) by mouth every 6 (six) hours.   NIFEdipine 60 MG 24 hr tablet Commonly known as: ADALAT CC Take 1 tablet (60 mg total) by mouth daily.   oxyCODONE 5 MG immediate release tablet Commonly known as: Oxy IR/ROXICODONE Take 1-2 tablets (5-10 mg total) by mouth every 6 (six) hours as needed for moderate pain (pain score 4-6).   senna-docusate 8.6-50 MG tablet Commonly known as: Senokot-S Take 2 tablets by mouth daily.   simethicone 80 MG chewable tablet Commonly known as: MYLICON Chew 1 tablet (80 mg total) by mouth as needed for flatulence.   witch hazel-glycerin pad Commonly known as: TUCKS Apply 1 Application topically as needed for hemorrhoids.         Follow-up Information      [facility] Follow up in 2 day(s).   Why: BP check Contact information: [address] [place] [address] [phone]        Schermerhorn, Ihor Austin, MD Follow up in 2 week(s).   Specialty: Obstetrics and Gynecology Why: post op/mood check i Contact information: [address] [place] [address] [phone]                 Signed: Chari Manning CNM

## 2023-03-27 NOTE — Brief Op Note (Signed)
 03/27/2023  11:06 AM  PATIENT:  [name]  28 y.o. female  PRE-OPERATIVE DIAGNOSIS:  elective repeat , 37+[redacted] weeks EGA  CHTN on meds  POST-OPERATIVE DIAGNOSIS:  same   PROCEDURE:  Procedure(s): REPEAT CESAREAN SECTION (N/A) LTCS SURGEON:  Surgeons and Role:    * Reyn Faivre, Ihor Austin, MD - Primary  PHYSICIAN ASSISTANT: Margaretmary Eddy CNM   ASSISTANTS: none   ANESTHESIA:   spinal  EBL: QBL = 530 mL, IOF 300 cc, ou 100 cc   BLOOD ADMINISTERED:none  DRAINS: Urinary Catheter (Foley)   LOCAL MEDICATIONS USED:  MARCAINE     SPECIMEN:  No Specimen  DISPOSITION OF SPECIMEN:  N/A  COUNTS:  YES  TOURNIQUET:  * No tourniquets in log *  DICTATION: .Other Dictation: Dictation Number verbal  PLAN OF CARE: Admit to inpatient   PATIENT DISPOSITION:  PACU - hemodynamically stable.   Delay start of Pharmacological VTE agent (>24hrs) due to surgical blood loss or risk of bleeding: not applicable

## 2023-03-27 NOTE — Anesthesia Preprocedure Evaluation (Addendum)
 Anesthesia Evaluation  Patient identified by MRN, date of birth, ID band Patient awake    Reviewed: Allergy & Precautions, NPO status , Patient's Chart, lab work & pertinent test results  History of Anesthesia Complications Negative for: history of anesthetic complications  Airway Mallampati: II   Neck ROM: Full    Dental no notable dental hx.    Pulmonary former smoker (quit 2017)   Pulmonary exam normal breath sounds clear to auscultation       Cardiovascular hypertension, Normal cardiovascular exam Rhythm:Regular Rate:Normal  ECG 08/22/22: Sinus tachycardia, otherwise normal  Echo 07/21/21: NORMAL LEFT VENTRICULAR SYSTOLIC FUNCTION WITH AN ESTIMATED EF = >55 %  NORMAL RIGHT VENTRICULAR SYSTOLIC FUNCTION  TRIVIAL REGURGITATION NOTED  NO VALVULAR STENOSIS     Neuro/Psych  PSYCHIATRIC DISORDERS Anxiety Depression    negative neurological ROS     GI/Hepatic ,GERD  ,,  Endo/Other  Obesity   Renal/GU negative Renal ROS     Musculoskeletal   Abdominal   Peds  Hematology negative hematology ROS (+)   Anesthesia Other Findings   Reproductive/Obstetrics                             Anesthesia Physical Anesthesia Plan  ASA: 2  Anesthesia Plan: Spinal   Post-op Pain Management:    Induction:   PONV Risk Score and Plan: 2 and Treatment may vary due to age or medical condition and Ondansetron  Airway Management Planned: Natural Airway and Nasal Cannula  Additional Equipment:   Intra-op Plan:   Post-operative Plan:   Informed Consent: I have reviewed the patients History and Physical, chart, labs and discussed the procedure including the risks, benefits and alternatives for the proposed anesthesia with the patient or authorized representative who has indicated his/her understanding and acceptance.     Dental Advisory Given  Plan Discussed with: Anesthesiologist, CRNA and  Surgeon  Anesthesia Plan Comments: (Patient reports no bleeding problems and no anticoagulant use.  Plan for spinal with backup GA.  Patient consented for risks of anesthesia including but not limited to:  - adverse reactions to medications - damage to eyes, teeth, lips or other oral mucosa - nerve damage due to positioning  - risk of bleeding, infection and or nerve damage from spinal that could lead to paralysis - risk of headache or failed spinal - damage to teeth, lips or other oral mucosa - sore throat or hoarseness - damage to heart, brain, nerves, lungs, other parts of body or loss of life  Patient voiced understanding and assent.)        Anesthesia Quick Evaluation

## 2023-03-27 NOTE — Anesthesia Procedure Notes (Signed)
 Date/Time: 03/27/2023 10:10 AM  Performed by: Ginger Carne, CRNAPre-anesthesia Checklist: Patient identified, Emergency Drugs available, Suction available, Patient being monitored and Timeout performed Patient Re-evaluated:Patient Re-evaluated prior to induction Oxygen Delivery Method: Nasal cannula Preoxygenation: Pre-oxygenation with 100% oxygen

## 2023-03-28 DIAGNOSIS — O9913 Other diseases of the blood and blood-forming organs and certain disorders involving the immune mechanism complicating the puerperium: Secondary | ICD-10-CM | POA: Diagnosis not present

## 2023-03-28 DIAGNOSIS — O9081 Anemia of the puerperium: Secondary | ICD-10-CM | POA: Diagnosis not present

## 2023-03-28 DIAGNOSIS — D6852 Prothrombin gene mutation: Secondary | ICD-10-CM | POA: Diagnosis not present

## 2023-03-28 LAB — CBC
HCT: 33 % — ABNORMAL LOW (ref 36.0–46.0)
Hemoglobin: 10.6 g/dL — ABNORMAL LOW (ref 12.0–15.0)
MCH: 24.9 pg — ABNORMAL LOW (ref 26.0–34.0)
MCHC: 32.1 g/dL (ref 30.0–36.0)
MCV: 77.6 fL — ABNORMAL LOW (ref 80.0–100.0)
Platelets: 309 10*3/uL (ref 150–400)
RBC: 4.25 MIL/uL (ref 3.87–5.11)
RDW: 14.7 % (ref 11.5–15.5)
WBC: 11.9 10*3/uL — ABNORMAL HIGH (ref 4.0–10.5)
nRBC: 0 % (ref 0.0–0.2)

## 2023-03-28 NOTE — Anesthesia Post-op Follow-up Note (Signed)
  Anesthesia Pain Follow-up Note  Patient: [name]  Day #: 1  Date of Follow-up: 03/28/2023 Time: 8:33 AM  Last Vitals:  Vitals:   03/28/23 0734 03/28/23 0734  BP: 138/89 138/89  Pulse: 100 100  Resp: 18 18  Temp:  36.7 C  SpO2: 98% 99%    Level of Consciousness: alert  Pain: none   Side Effects:None  Catheter Site Exam:clean, dry, no drainage  Anti-Coag Meds (From admission, onward)    Start     Dose/Rate Route Frequency Ordered Stop   03/28/23 1000  enoxaparin (LOVENOX) injection 40 mg        40 mg Subcutaneous Every 24 hours 03/27/23 1609          Plan: D/C from anesthesia care at surgeon's request  [name]

## 2023-03-28 NOTE — Progress Notes (Signed)
 Post Partum Day 1  Subjective: Very tired today, baby cluster fed last night and her and her husband didn't get much sleep.  Doing well, no concerns. Ambulating without difficulty, pain managed with PO meds, tolerating regular diet, and voiding without difficulty.   No fever/chills, chest pain, shortness of breath, nausea/vomiting, or leg pain. No nipple or breast pain. No headache, visual changes, or RUQ/epigastric pain.  Objective: BP 138/89 (BP Location: Left Arm)   Pulse 100   Temp 98.1 F (36.7 C) (Oral)   Resp 18   Ht 5\' 9"  (1.753 m)   Wt 122.5 kg   LMP 07/05/2022   SpO2 99%   Breastfeeding Unknown   BMI 39.87 kg/m    Physical Exam:  General: alert, cooperative, and fatigued Breasts: soft/nontender CV: RRR Pulm: nl effort Abdomen: soft, non-tender Uterine Fundus: firm Incision: no significant drainage, pressure dressing still in place Perineum: intact Lochia: appropriate DVT Evaluation: No evidence of DVT seen on physical exam. Edinburgh:     03/28/2023    8:00 AM 09/02/2021   12:05 AM 03/01/2019   12:10 PM 02/28/2019    7:30 AM  Edinburgh Postnatal Depression Scale Screening Tool  I have been able to laugh and see the funny side of things. 0 0 0 --  I have looked forward with enjoyment to things. 1 2 0   I have blamed myself unnecessarily when things went wrong. 1 1 2    I have been anxious or worried for no good reason. 1 2 2    I have felt scared or panicky for no good reason. 1 1 1    Things have been getting on top of me. 1 1 1    I have been so unhappy that I have had difficulty sleeping. 0 1 1   I have felt sad or miserable. 1 2 1    I have been so unhappy that I have been crying. 0 2 1   The thought of harming myself has occurred to me. 0 0 0   Edinburgh Postnatal Depression Scale Total 6 12 9       Recent Labs    03/28/23 0438  HGB 10.6*  HCT 33.0*  WBC 11.9*  PLT 309    Assessment/Plan: 28 y.o. G3P3003 postpartum day # 1  1. Continue routine  postpartum care  2. Infant feeding status: formula feeding  3. Contraception plan:  TBD  4. Acute blood loss anemia - clinically not significant .  -Hemodynamically stable and asymptomatic -Intervention: start on oral supplementation with ferrous sulfate 325 mg  and no intervention   5. Immunization status:   all immunizations up to date  6. CHTN -Currently on Procardia 30mg  every day -Urine output on night shift 3mL/hr Vitals:   03/27/23 1315 03/27/23 1330 03/27/23 1345 03/27/23 1400  BP: (!) 143/74 (!) 143/79 (!) 141/80 (!) 149/88   03/27/23 1415 03/27/23 1534 03/27/23 1623 03/27/23 1931  BP: (!) 155/84 137/67 (!) 154/84 137/87   03/27/23 2300 03/28/23 0234 03/28/23 0734 03/28/23 0734  BP: (!) 145/86 139/86 138/89 138/89    7. Prothrombin gene mutation -24 hrs after C/S start Lovenox 40mg  every day -Continue Lovenox 6 weeks PP  Disposition: Continue inpatient postpartum care   LOS: 1 day   [name], CNM 03/28/2023, 8:53 AM

## 2023-03-28 NOTE — Anesthesia Postprocedure Evaluation (Signed)
 Anesthesia Post Note  Patient: [name]  Procedure(s) Performed: REPEAT CESAREAN SECTION  Patient location during evaluation: Mother Baby Anesthesia Type: Spinal Level of consciousness: oriented and awake and alert Pain management: pain level controlled Vital Signs Assessment: post-procedure vital signs reviewed and stable Respiratory status: spontaneous breathing and respiratory function stable Cardiovascular status: blood pressure returned to baseline and stable Postop Assessment: no headache, no backache, no apparent nausea or vomiting and able to ambulate Anesthetic complications: no   No notable events documented.   Last Vitals:  Vitals:   03/28/23 0734 03/28/23 0734  BP: 138/89 138/89  Pulse: 100 100  Resp: 18 18  Temp:  36.7 C  SpO2: 98% 99%    Last Pain:  Vitals:   03/28/23 0734  TempSrc: Oral  PainSc:                  [name]

## 2023-03-29 ENCOUNTER — Encounter: Payer: Self-pay | Admitting: Obstetrics and Gynecology

## 2023-03-29 MED ORDER — COCONUT OIL OIL: 1.0000 | TOPICAL_OIL | Status: AC | PRN

## 2023-03-29 MED ORDER — NIFEDIPINE ER OSMOTIC RELEASE 30 MG PO TB24
60.0000 mg | ORAL_TABLET | Freq: Every day | ORAL | Status: DC
  Administered 2023-03-29: 60 mg via ORAL
  Filled 2023-03-29: qty 2

## 2023-03-29 MED ORDER — WITCH HAZEL-GLYCERIN EX PADS: 1.0000 | MEDICATED_PAD | CUTANEOUS | 12 refills | Status: AC | PRN

## 2023-03-29 MED ORDER — DIBUCAINE (PERIANAL) 1 % EX OINT: 1.0000 | TOPICAL_OINTMENT | CUTANEOUS | Status: AC | PRN

## 2023-03-29 MED ORDER — SENNOSIDES-DOCUSATE SODIUM 8.6-50 MG PO TABS
2.0000 | ORAL_TABLET | Freq: Every day | ORAL | Status: DC
Start: 2023-03-29 — End: 2023-03-31

## 2023-03-29 MED ORDER — SIMETHICONE 80 MG PO CHEW
80.0000 mg | CHEWABLE_TABLET | ORAL | Status: DC | PRN
Start: 2023-03-29 — End: 2023-03-31

## 2023-03-29 MED ORDER — NIFEDIPINE ER 60 MG PO TB24: 60.0000 mg | ORAL_TABLET | Freq: Every day | ORAL | 0 refills | Status: DC

## 2023-03-29 MED ORDER — OXYCODONE HCL 5 MG PO TABS
5.0000 mg | ORAL_TABLET | Freq: Four times a day (QID) | ORAL | 0 refills | Status: AC | PRN
Start: 2023-03-29 — End: ?

## 2023-03-29 MED ORDER — ACETAMINOPHEN 500 MG PO TABS: 1000.0000 mg | ORAL_TABLET | Freq: Four times a day (QID) | ORAL | 0 refills | Status: DC

## 2023-03-29 MED ORDER — ENOXAPARIN SODIUM 40 MG/0.4ML IJ SOSY: 40.0000 mg | PREFILLED_SYRINGE | INTRAMUSCULAR | 0 refills | Status: AC

## 2023-03-29 MED ORDER — IBUPROFEN 600 MG PO TABS: 600.0000 mg | ORAL_TABLET | Freq: Four times a day (QID) | ORAL | 0 refills | Status: AC

## 2023-03-29 NOTE — Progress Notes (Signed)
 Patient discharged. Discharge instructions given. Patient verbalizes understanding. Transported by axillary.

## 2023-03-29 NOTE — Discharge Instructions (Signed)
Discharge instructions Bleeding: Your bleeding could continue up to 6 weeks, the flow should gradually decrease and the color should become dark then lightened over the next couple of weeks. If you notice you are bleeding heavily or passing clots larger than the size of your fist, PLEASE call your physician. No TAMPONS, DOUCHING, ENEMAS OR SEXUAL INTERCOURSE for 6 weeks. Incision: The honeycomb dressing can be removed in 5-7 days. Remove earlier if any water gets underneath it or if there is a lot of drainage. After the dressing is off, you can let the warm soapy water from the shower run over the incision and pat dry with a clean towel. Watch the incision for signs of infection, such as, redness, warmth, oozing pus. AfterPains: This is the uterus contracting back to its normal position and size. Use medications prescribed or recommended by your physician to help relieve this discomfort. Bowels/Hemorrhoids: Drink plenty of water and stay active. Increase fiber, fresh fruits and vegetables in your diet. Rest/Activity: Rest when the baby is resting;  Do not lift > 10 lbs for 6 weeks. No driving for 1-2 weeks. Bathing: Shower daily! Diet: Continue daily prenatal vitamin and iron until your follow up visit to help replenish nutrients and vitamins. If breastfeeding eat extra calories and increase your fluid intake to 12 glasses a day. Contraception: Consult with your provider on what method of birth control you would like to use. Breastfeeding: You may have a slight fever when your milk comes in, but it should go away on its own. If it does not, and rises above 101.0 please call the doctor. Bottlefeeding: wear a snug fitting bra without underwires continuously for 3-5days, avoid any nipple/breast stimulation. If engorgement occurs, take ibuprofen as prescribed and apply fresh green cabbage leaves directly to your breasts inside the bra cups. Postpartum "BLUES": It is common to emotional days after delivery,  however if it persist for greater than 2 weeks or if you feel concerned please let your physician know immediately. This is hormone driven and nothing you can control so please let someone know how you feel. Follow Up Visit: Please schedule a follow up visit with your delivering provider  Call office if you have any of the following: headache, visual changes, fever >101.0 F, chills, breast concerns, excessive vaginal bleeding, incision drainage or problems, leg pain or redness, depression or any other concerns.  For concerns about your baby, please call your pediatrician For breastfeeding concerns, the lactation consultant can be reached at [phone]

## 2023-03-31 ENCOUNTER — Encounter: Payer: Self-pay | Admitting: Obstetrics and Gynecology

## 2023-03-31 ENCOUNTER — Other Ambulatory Visit: Payer: Self-pay

## 2023-03-31 ENCOUNTER — Observation Stay
Admission: EM | Admit: 2023-03-31 | Discharge: 2023-03-31 | Disposition: A | Discharge status: Home / Self Care | Attending: Certified Nurse Midwife | Admitting: Certified Nurse Midwife

## 2023-03-31 DIAGNOSIS — O9963 Diseases of the digestive system complicating the puerperium: Secondary | ICD-10-CM | POA: Diagnosis not present

## 2023-03-31 DIAGNOSIS — Z87891 Personal history of nicotine dependence: Secondary | ICD-10-CM | POA: Insufficient documentation

## 2023-03-31 DIAGNOSIS — R519 Headache, unspecified: Secondary | ICD-10-CM | POA: Insufficient documentation

## 2023-03-31 DIAGNOSIS — K5900 Constipation, unspecified: Secondary | ICD-10-CM | POA: Diagnosis not present

## 2023-03-31 DIAGNOSIS — Z79899 Other long term (current) drug therapy: Secondary | ICD-10-CM | POA: Diagnosis not present

## 2023-03-31 DIAGNOSIS — O99893 Other specified diseases and conditions complicating puerperium: Secondary | ICD-10-CM | POA: Insufficient documentation

## 2023-03-31 DIAGNOSIS — O1093 Unspecified pre-existing hypertension complicating the puerperium: Principal | ICD-10-CM | POA: Insufficient documentation

## 2023-03-31 DIAGNOSIS — R600 Localized edema: Secondary | ICD-10-CM | POA: Diagnosis not present

## 2023-03-31 DIAGNOSIS — O165 Unspecified maternal hypertension, complicating the puerperium: Secondary | ICD-10-CM | POA: Diagnosis present

## 2023-03-31 DIAGNOSIS — R030 Elevated blood-pressure reading, without diagnosis of hypertension: Principal | ICD-10-CM | POA: Diagnosis not present

## 2023-03-31 LAB — COMPREHENSIVE METABOLIC PANEL
ALT: 30 U/L (ref 0–44)
AST: 51 U/L — ABNORMAL HIGH (ref 15–41)
Albumin: 3.7 g/dL (ref 3.5–5.0)
Alkaline Phosphatase: 78 U/L (ref 38–126)
Anion gap: 14 (ref 5–15)
BUN: 11 mg/dL (ref 6–20)
CO2: 21 mmol/L — ABNORMAL LOW (ref 22–32)
Calcium: 9.1 mg/dL (ref 8.9–10.3)
Chloride: 102 mmol/L (ref 98–111)
Creatinine, Ser: 0.56 mg/dL (ref 0.44–1.00)
GFR, Estimated: >60 mL/min (ref >60)
Glucose, Bld: 97 mg/dL (ref 70–99)
Potassium: 4 mmol/L (ref 3.5–5.1)
Sodium: 137 mmol/L (ref 135–145)
Total Bilirubin: 0.7 mg/dL (ref 0.0–1.2)
Total Protein: 7.8 g/dL (ref 6.5–8.1)

## 2023-03-31 LAB — PROTEIN / CREATININE RATIO, URINE
Creatinine, Urine: 30 mg/dL
Protein Creatinine Ratio: 0.23 mg/mg{creat} — ABNORMAL HIGH (ref 0.00–0.15)
Total Protein, Urine: 7 mg/dL

## 2023-03-31 LAB — CBC
HCT: 35.1 % — ABNORMAL LOW (ref 36.0–46.0)
Hemoglobin: 11.4 g/dL — ABNORMAL LOW (ref 12.0–15.0)
MCH: 24.8 pg — ABNORMAL LOW (ref 26.0–34.0)
MCHC: 32.5 g/dL (ref 30.0–36.0)
MCV: 76.5 fL — ABNORMAL LOW (ref 80.0–100.0)
Platelets: 344 10*3/uL (ref 150–400)
RBC: 4.59 MIL/uL (ref 3.87–5.11)
RDW: 14.6 % (ref 11.5–15.5)
WBC: 7.7 10*3/uL (ref 4.0–10.5)
nRBC: 0 % (ref 0.0–0.2)

## 2023-03-31 MED ORDER — LABETALOL HCL 5 MG/ML IV SOLN: 20.0000 mg | INTRAVENOUS | Status: DC | PRN

## 2023-03-31 MED ORDER — LABETALOL HCL 5 MG/ML IV SOLN: 80.0000 mg | INTRAVENOUS | Status: DC | PRN

## 2023-03-31 MED ORDER — POLYETHYLENE GLYCOL 3350 17 G PO PACK
17.0000 g | PACK | Freq: Once | ORAL | Status: AC
  Administered 2023-03-31: 17 g via ORAL
  Filled 2023-03-31: qty 1

## 2023-03-31 MED ORDER — DOCUSATE SODIUM 100 MG PO CAPS: 100.0000 mg | ORAL_CAPSULE | Freq: Two times a day (BID) | ORAL | 0 refills | Status: AC

## 2023-03-31 MED ORDER — HYDRALAZINE HCL 20 MG/ML IJ SOLN: 10.0000 mg | INTRAMUSCULAR | Status: DC | PRN

## 2023-03-31 MED ORDER — IBUPROFEN 600 MG PO TABS
600.0000 mg | ORAL_TABLET | Freq: Once | ORAL | Status: AC
  Administered 2023-03-31: 600 mg via ORAL
  Filled 2023-03-31: qty 1

## 2023-03-31 MED ORDER — LABETALOL HCL 5 MG/ML IV SOLN: 40.0000 mg | INTRAVENOUS | Status: DC | PRN

## 2023-03-31 NOTE — Discharge Summary (Signed)
 [name] is a 28 y.o. female. She is at Unknown gestation. No LMP recorded. Estimated Date of Delivery: None noted.  Prenatal care site: [facility]  Chief complaint: Elevated Blood Pressure  HPI: [name] presents to L&D with concerns of elevated blood pressure at home, swelling in BLE and headache. She reports she called [facility] today (03/31/2023) and was told to come to L&D triage to be further evaluated based on symptoms. She delivered on 03/27/2023 via repeat low transverse, c-section. She is postpartum day # 4. She rates her headache a 3/10. Reports she took Tylenol around 1400 today (03/31/2023) which provided some relief. She states in L&D triage, currently she is not experiencing a headache. She states at home she had a couple of blood pressure readings that was greater than 140/90 while she was sitting in the recliner. She is currently taking Procardia 60 mg PO daily. Denies visual disturbances and RUQ pain. DTR is +2 and clonus is not present. She also reports concerns of constipation despite interventions at home which include Colace and mag oxide.   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    Past  Surgical Hx:  has a past surgical history that includes Wisdom tooth extraction (N/A, 2013); Cesarean section (08/31/2021); and Cesarean section (N/A, 03/27/2023).   Allergies  Allergen Reactions   Estrogens Other (See Comments)    Hx clotting disorder     Prior to Admission medications   Medication Sig Start Date End Date Taking? Authorizing Provider  acetaminophen (TYLENOL) 500 MG tablet Take 2 tablets (1,000 mg total) by mouth every 6 (six) hours. 03/29/23  Yes Chari Manning, CNM  enoxaparin (LOVENOX) 40 MG/0.4ML injection Inject 0.4 mLs (40 mg total) into the skin daily. 03/29/23 05/10/23 Yes Chari Manning, CNM  ibuprofen (ADVIL) 600 MG tablet Take 1 tablet (600 mg total) by mouth every 6 (six) hours. 03/29/23  Yes Chari Manning, CNM  NIFEdipine (ADALAT CC) 60 MG 24 hr tablet Take 1 tablet (60 mg total) by mouth daily. 03/29/23 04/28/23 Yes Chari Manning, CNM  oxyCODONE (OXY IR/ROXICODONE) 5 MG immediate release tablet Take 1-2 tablets (5-10 mg total) by mouth every 6 (six) hours as needed for moderate pain (pain score 4-6). 03/29/23  Yes Chari Manning, CNM  senna-docusate (SENOKOT-S) 8.6-50 MG tablet Take 2 tablets by mouth daily. 03/29/23  Yes Chari Manning, CNM  simethicone (MYLICON) 80 MG chewable tablet Chew 1 tablet (80 mg total) by mouth as needed for flatulence. 03/29/23  Yes Chari Manning, CNM  witch hazel-glycerin (TUCKS) pad Apply 1 Application topically as needed for hemorrhoids. 03/29/23  Yes Chari Manning, CNM  coconut oil OIL Apply 1 Application topically as needed. 03/29/23   Chari Manning, CNM  dibucaine (NUPERCAINAL) 1 % OINT Place 1 Application rectally as needed for hemorrhoids. Patient not taking: Reported on 03/31/2023 03/29/23   Chari Manning, CNM    Social History: She  reports that she quit smoking about 7 years ago. Her smoking use included cigarettes. She has never used smokeless tobacco. She reports that she does not currently use alcohol.  She reports that she does not use drugs.  Family History: family history includes Breast cancer in her paternal aunt; Clotting disorder in her mother; Heart attack in her maternal grandfather; Heart disease in her maternal grandfather; Hypertension in her maternal grandmother; Kidney disease in her sister; Stroke in her maternal grandfather. No history of gyn cancers  Review of Systems:  Review of Systems  Constitutional: Negative.   Eyes:  Negative for blurred vision and double vision.  Respiratory: Negative.    Cardiovascular:  Positive for leg swelling (Mild leg swelling to BLE).  Gastrointestinal:  Positive for constipation. Negative for abdominal pain (Denies RUQ pain).  Genitourinary: Negative.   Musculoskeletal: Negative.   Neurological:  Negative for dizziness, weakness and headaches.  Psychiatric/Behavioral: Negative.       O:  BP 137/85   Pulse (!) 109   Temp 98.2 F (36.8 C) (Oral)   Ht 5\' 9"  (1.753 m)   Wt 115.2 kg   BMI 37.50 kg/m   Vitals:   03/31/23 1816 03/31/23 1832 03/31/23 1847 03/31/23 1902  BP: 138/82 134/70 132/79 137/85    Results for orders placed or performed during the hospital encounter of 03/31/23 (from the past 48 hours)  CBC   Collection Time: 03/31/23  6:18 PM  Result Value Ref Range   WBC 7.7 4.0 - 10.5 K/uL   RBC 4.59 3.87 - 5.11 MIL/uL   Hemoglobin 11.4 (L) 12.0 - 15.0 g/dL   HCT 40.9 (L) 81.1 - 91.4 %   MCV 76.5 (L) 80.0 - 100.0 fL   MCH 24.8 (L) 26.0 - 34.0 pg   MCHC 32.5 30.0 - 36.0 g/dL   RDW 78.2 95.6 - 21.3 %   Platelets 344 150 - 400 K/uL   nRBC 0.0 0.0 - 0.2 %     Constitutional: NAD, AAOx3  HE/ENT: extraocular movements grossly intact, moist mucous membranes CV: RRR PULM: nl respiratory effort, CTABL Abd: s/nd/nt Ext:  mild swelling noted to BLE, nonpitting Psych: mood appropriate, speech normal  Assessment: 28 y.o. G3P3, postpartum day #4, here for postpartum surveillance. She delivered on 03/27/2023 via repeat, low  transversa c-section.   Principle diagnosis: Elevated Blood Pressure  There were no encounter diagnoses.   Plan: Elevated Blood Pressure Reading CBC, CMP, and Prot/CreaU ratio collected; Results as follows:   - CBC--> WNL    - Platelets: 344    - WBC: 7.7    - Hgb: 11.4    - RBC: 4.59   - CMP--> WNL    - AST: 51    - ALT: 30   - Prot/CreaU ratio: 0.23 Blood pressures cycled q 15 mins (range from 132-138/70-85)   Antihypertensive protocol ordered, if needed  Patient reports she received dosage adjustment to Procardia 90 mg from 60 mg today.  - Prescription for Procardia 90 mg PO daily sent to preferred pharmacy    already by [facility] provider today (03/31/2023), per patient.  Signs/symptoms of Pre-E postpartum discussed with patient. Patient verbalized understanding.  Patient instructed to continue to take blood pressure BID and bring log to BP check visit on Tuesday.   2. Constipation Miralax packet 17 g given.  Prescription for 100 mg Colace BID prn sent to preferred pharmacy,  Other OTC medications discussed with patient at bedside for management of constipation. Patient verbalized  understanding.   Education on how to prevent constipation after surgery provided in AVS.   3. Headache  Ibuprofen 600 mg given. Patient rates pain 0/0.  Dim lighting offered to patient. Patient declined.  Increased fluid intake encouraged.   - Patient instructed to follow-up with OB provider at [facility] for BP check on Tuesday    (04/04/2023).    Morning and to bring at home blood pressure kit for proper fit. Patient verbalized     understanding. - D/c home stable, strict return precautions reviewed, follow-up as needed.   ----- Roney Jaffe, CNM Certified Nurse Midwife [facility]

## 2023-03-31 NOTE — Progress Notes (Signed)
 Pt presents to L/D triage with reported elevated Bps at home, headache rated 3/10 despite pain medication and rest, and general edema. She received a dosage adjustment to procardia 90mg  from 60 mg today- she has taken 60 mg today. Pt reports no blurry vision or epigastric pain. No atypical weight gain.+2 reflexes, no clonus. She also reports small BM today but still feeling constipated despite interventions. Initial BP 138/82. Cycling q15 min. Labs and PC ratio pending.  Pt reports normal postpartum bleeding and incisional soreness. Baby at home. Pt has history of CHTN.

## 2023-03-31 NOTE — OB Triage Note (Signed)
 Patient d/c with paperwork regarding prescriptions as well as follow up appointment, pt verbally educated with teach abck method. Pt understands should her headache or other pains get worse again or if her blood pressure is elevated after two checks 160's/110's then to call clinic and come in to be seen. Patient wheeled downstairs.

## 2023-03-31 NOTE — Discharge Instructions (Signed)
 Continue to take blood pressure twice a day (in AM and in PM) and bring blood pressure log to BP check visit on Tuesday (04/04/2023).

## 2023-04-06 DIAGNOSIS — O10919 Unspecified pre-existing hypertension complicating pregnancy, unspecified trimester: Secondary | ICD-10-CM | POA: Diagnosis not present

## 2023-05-06 DIAGNOSIS — Z419 Encounter for procedure for purposes other than remedying health state, unspecified: Secondary | ICD-10-CM | POA: Diagnosis not present

## 2023-06-05 DIAGNOSIS — Z419 Encounter for procedure for purposes other than remedying health state, unspecified: Secondary | ICD-10-CM | POA: Diagnosis not present

## 2023-07-06 DIAGNOSIS — Z419 Encounter for procedure for purposes other than remedying health state, unspecified: Secondary | ICD-10-CM | POA: Diagnosis not present

## 2023-08-05 DIAGNOSIS — Z419 Encounter for procedure for purposes other than remedying health state, unspecified: Secondary | ICD-10-CM | POA: Diagnosis not present

## 2023-09-05 DIAGNOSIS — Z419 Encounter for procedure for purposes other than remedying health state, unspecified: Secondary | ICD-10-CM | POA: Diagnosis not present

## 2023-10-06 DIAGNOSIS — Z419 Encounter for procedure for purposes other than remedying health state, unspecified: Secondary | ICD-10-CM | POA: Diagnosis not present

## 2024-01-05 DIAGNOSIS — Z419 Encounter for procedure for purposes other than remedying health state, unspecified: Secondary | ICD-10-CM | POA: Diagnosis not present

## 2024-01-09 ENCOUNTER — Other Ambulatory Visit: Payer: Self-pay | Admitting: Maternal & Fetal Medicine

## 2024-01-09 DIAGNOSIS — O10913 Unspecified pre-existing hypertension complicating pregnancy, third trimester: Secondary | ICD-10-CM
